# Patient Record
Sex: Female | Born: 1995 | Race: White | Hispanic: No | Marital: Married | Smoking: Never smoker
Health system: Southern US, Community
[De-identification: ages and names within clinical notes are randomized; demographics above are authoritative.]

## PROBLEM LIST (undated history)

## (undated) DIAGNOSIS — Z0000 Encounter for general adult medical examination without abnormal findings: Secondary | ICD-10-CM

## (undated) DIAGNOSIS — E785 Hyperlipidemia, unspecified: Secondary | ICD-10-CM

## (undated) HISTORY — DX: Encounter for general adult medical examination without abnormal findings: Z00.00

## (undated) HISTORY — PX: TONSILLECTOMY: SUR1361

---

## 2017-04-16 ENCOUNTER — Encounter: Payer: Self-pay | Admitting: Nurse Practitioner

## 2017-04-16 ENCOUNTER — Ambulatory Visit (INDEPENDENT_AMBULATORY_CARE_PROVIDER_SITE_OTHER): Payer: 59 | Admitting: Nurse Practitioner

## 2017-04-16 VITALS — BP 118/64 | HR 79 | Temp 98.0°F | Ht 67.0 in | Wt 270.4 lb

## 2017-04-16 DIAGNOSIS — Z23 Encounter for immunization: Secondary | ICD-10-CM | POA: Diagnosis not present

## 2017-04-16 DIAGNOSIS — E663 Overweight: Secondary | ICD-10-CM

## 2017-04-16 DIAGNOSIS — Z7689 Persons encountering health services in other specified circumstances: Secondary | ICD-10-CM

## 2017-04-16 DIAGNOSIS — Z6841 Body Mass Index (BMI) 40.0 and over, adult: Secondary | ICD-10-CM | POA: Diagnosis not present

## 2017-04-16 NOTE — Patient Instructions (Addendum)
[place], Thank you for coming in to clinic today.  1. Start logging your food with MyFitnessPal website or smartphone app.  Establish baseline calorie intake and reduce by about 500 calories per day probably around 1400 - 1600 calories per day.  Continue for 1-2 weeks, then stop calorie counting, continue food log, and continue new eating pattern. - Weight loss goals 1/2 to 1 lb weekly.  - Consider nutrition visits if covered by insurance. Let me know if you would like a referral.  You will be due for FASTING BLOOD WORK at your annual physical (no food or drink after midnight before, only water or coffee without cream/sugar on the morning of).  For Lab Results, once available within 2-3 days of blood draw, you can can log in to MyChart online to view your results and a brief explanation. Also, we can discuss results at next follow-up visit.   Please schedule a follow-up appointment with Wilhelmina McardleLauren Renley Gutman, AGNP to Return in about 2 weeks (around 04/30/2017) for annual physical w/ PAP (2-6 weeks).    If you have any other questions or concerns, please feel free to call the clinic or send a message through MyChart. You may also schedule an earlier appointment if necessary.  Wilhelmina McardleLauren Priyana Mccarey, DNP, AGNP-BC Adult Gerontology Nurse Practitioner [facility], [facility]

## 2017-04-16 NOTE — Progress Notes (Signed)
Subjective:    Patient ID: Tracey Khan, female    DOB: [birth year], 21 y.o.   MRN: [id number]  Tracey CallanderJocelynne Khan is a 21 y.o. female presenting on 04/16/2017 for Establish Care   HPI Establish Care New Provider Pt last seen by PCP many years ago w/ pediatrics at about age 21. Pt brings immunization history for childhood vaccines with her today.  She does not have any records of vaccines after age 21 or any recall of vaccination after age 21.  She went to school for 1-3 grade in [place] and grades 4-12 in [place].  [place] vaccine records do not show any immunizations given.  Feels well no acute concerns today.   Morbid Obesity - Pt perceives herself as overweight and has desire for weight loss of 70 lbs for a goal of 200 lbs.   - She notes typical dietary choices as follows: NO breakfast, but cream and sugar w/ coffee Lunch: light meal - fast food combo w/ french fries 2-3 days per week Dinner: eating out 3-4 times per week, cook vegetables, no fried foods at home Small snack: peanuts, cookies, ice cream  - She is regularly physically active in her job only w/ prolonged standing and walking.   Past Medical History:  Diagnosis Date  . Healthy adult on routine physical examination    Past Surgical History:  Procedure Laterality Date  . TONSILLECTOMY     Social History   Social History  . Marital status: Married    Spouse name: N/A  . Number of children: N/A  . Years of education: N/A   Occupational History  . Not on file.   Social History Main Topics  . Smoking status: Never Smoker  . Smokeless tobacco: Never Used  . Alcohol use 0.6 oz/week    1 Glasses of wine per week     Comment: occasionally  . Drug use: No  . Sexual activity: Yes    Birth control/ protection: None     Comment: mutually monogamous relationship   Other Topics Concern  . Not on file   Social History Narrative  . No narrative on file   Family History  Problem Relation Age of Onset  . Healthy Mother    . Hypertension Father   . Healthy Sister   . Healthy Brother   . Hyperlipidemia Maternal Grandmother   . Heart attack Maternal Grandfather   . Healthy Paternal Grandmother   . Stroke Paternal Grandfather   . Colon cancer Neg Hx   . Breast cancer Neg Hx   . Ovarian cancer Neg Hx    No current outpatient prescriptions on file prior to visit.   No current facility-administered medications on file prior to visit.     Review of Systems  Constitutional: Negative.   HENT: Negative.   Eyes: Negative.   Respiratory: Negative.   Cardiovascular: Negative.   Gastrointestinal: Negative.   Endocrine: Negative.   Genitourinary: Negative.   Skin: Negative.   Allergic/Immunologic: Negative.   Neurological: Positive for headaches.  Hematological: Bruises/bleeds easily.  Psychiatric/Behavioral: Negative.    Per HPI unless specifically indicated above      Objective:    BP 118/64 (BP Location: Right Arm, Patient Position: Sitting, Cuff Size: Large)   Pulse 79   Temp 98 F (36.7 C) (Oral)   Ht 5\' 7"  (1.702 m)   Wt 270 lb 6.4 oz (122.7 kg)   LMP 04/10/2017   BMI 42.35 kg/m   Wt Readings from Last 3 Encounters:  04/16/17 270 lb 6.4 oz (122.7 kg)    Physical Exam  General - morbidly obese, well-appearing, NAD HEENT - Normocephalic, atraumatic, PERRL, EOMI, patent nares w/o congestion, oropharynx clear, MMM Neck - supple, non-tender, no LAD, no thyromegaly Heart - RRR, no murmurs heard Lungs - Clear throughout all lobes, no wheezing, crackles, or rhonchi. Normal work of breathing. Abdomen - soft, obese, NTND, no masses, no hepatosplenomegaly noted w/ scratch test, active bowel sounds Extremeties - non-tender, no edema, cap refill < 2 seconds, peripheral pulses intact +2 bilaterally Skin - warm, dry, no rashes Neuro - awake, alert, oriented x3, normal gait Psych - Normal mood and affect, normal behavior   No results found for this or any previous visit.    Assessment & Plan:     Problem List Items Addressed This Visit      Other   BMI 40.0-44.9, adult (HCC) - Primary    Pt morbidly obese likely related to calorie over nutrition as diet recall indicates many meals out and fast food meals.  Pt stated weight goal is less than 200 lbs.  She states desire to begin losing weight and is motivated to start.  Plan: 1. Reviewed calorie intake and tips for reducing overall calories while staying full. 2. Discussed MyFitnessPal.  Establish baseline intake and reduce by about 500 calories per day probably around 1400 - 1600 calories per day.  Continue for 1-2 weeks, then stop calorie counting, continue food log, and continue new eating pattern. - Weight loss goals 1/2 to 1 lb weekly. 3.Consider nutrition visits if covered by insurance or pt willing to pay out of pocket. 4. Follow up 3 months.         Other Visit Diagnoses    Encounter for vaccination     Pt w/ no recall of last tetanus vaccine.  Her last recorded Dtap was 2001 and she does not remember receiving vaccines in middle school.  Plan: 1. Administer Tdap today.    Encounter to establish care     Pt w/o recent PCP.  Last visit for wellness exam was at approx 21 years of age.  Pt is due for screening PAP smear by age.    Plan: 1. Reviewed steps for PAP smear, pt verbalizes some fear about this new experience.  Reassurance provided.  P would like to schedule her physical w/ PAP in about 2 weeks.        Follow up plan: Return in about 2 weeks (around 04/30/2017) for annual physical w/ PAP (2-6 weeks).  Tracey Mcardle, DNP, AGPCNP-BC Adult Gerontology Primary Care Nurse Practitioner [facility] 04/16/2017, 9:57 AM

## 2017-04-16 NOTE — Assessment & Plan Note (Signed)
Pt morbidly obese likely related to calorie over nutrition as diet recall indicates many meals out and fast food meals.  Pt stated weight goal is less than 200 lbs.  She states desire to begin losing weight and is motivated to start.  Plan: 1. Reviewed calorie intake and tips for reducing overall calories while staying full. 2. Discussed MyFitnessPal.  Establish baseline intake and reduce by about 500 calories per day probably around 1400 - 1600 calories per day.  Continue for 1-2 weeks, then stop calorie counting, continue food log, and continue new eating pattern. - Weight loss goals 1/2 to 1 lb weekly. 3.Consider nutrition visits if covered by insurance or pt willing to pay out of pocket. 4. Follow up 3 months.

## 2017-04-16 NOTE — Progress Notes (Signed)
I have reviewed this encounter including the documentation in this note and/or discussed this patient with the provider, Wilhelmina McardleLauren Kennedy, AGPCNP-BC. I am certifying that I agree with the content of this note as supervising physician.  Saralyn PilarAlexander Hser Belanger, DO [facility] 04/16/2017, 3:10 PM

## 2017-04-30 ENCOUNTER — Other Ambulatory Visit: Payer: Self-pay | Admitting: Nurse Practitioner

## 2017-04-30 ENCOUNTER — Encounter: Payer: Self-pay | Admitting: Nurse Practitioner

## 2017-04-30 ENCOUNTER — Ambulatory Visit (INDEPENDENT_AMBULATORY_CARE_PROVIDER_SITE_OTHER): Payer: 59 | Admitting: Nurse Practitioner

## 2017-04-30 VITALS — BP 130/80 | HR 98 | Temp 97.9°F | Ht 67.0 in | Wt 266.6 lb

## 2017-04-30 DIAGNOSIS — B373 Candidiasis of vulva and vagina: Secondary | ICD-10-CM | POA: Diagnosis not present

## 2017-04-30 DIAGNOSIS — Z124 Encounter for screening for malignant neoplasm of cervix: Secondary | ICD-10-CM

## 2017-04-30 DIAGNOSIS — B3731 Acute candidiasis of vulva and vagina: Secondary | ICD-10-CM

## 2017-04-30 DIAGNOSIS — Z0000 Encounter for general adult medical examination without abnormal findings: Secondary | ICD-10-CM | POA: Diagnosis not present

## 2017-04-30 LAB — COMPREHENSIVE METABOLIC PANEL
ALT: 21 U/L (ref 6–29)
AST: 18 U/L (ref 10–30)
Albumin: 4.1 g/dL (ref 3.6–5.1)
Alkaline Phosphatase: 68 U/L (ref 33–115)
BUN: 11 mg/dL (ref 7–25)
CO2: 25 mmol/L (ref 20–31)
Calcium: 9.4 mg/dL (ref 8.6–10.2)
Chloride: 102 mmol/L (ref 98–110)
Creat: 0.97 mg/dL (ref 0.50–1.10)
Glucose, Bld: 95 mg/dL (ref 65–99)
Potassium: 4.4 mmol/L (ref 3.5–5.3)
Sodium: 137 mmol/L (ref 135–146)
Total Bilirubin: 0.5 mg/dL (ref 0.2–1.2)
Total Protein: 6.6 g/dL (ref 6.1–8.1)

## 2017-04-30 LAB — CBC WITH DIFFERENTIAL/PLATELET
Basophils Absolute: 0 cells/uL (ref 0–200)
Basophils Relative: 0 %
Eosinophils Absolute: 67 cells/uL (ref 15–500)
Eosinophils Relative: 1 %
HCT: 42.1 % (ref 35.0–45.0)
Hemoglobin: 13.8 g/dL (ref 11.7–15.5)
Lymphocytes Relative: 27 %
Lymphs Abs: 1809 cells/uL (ref 850–3900)
MCH: 27.2 pg (ref 27.0–33.0)
MCHC: 32.8 g/dL (ref 32.0–36.0)
MCV: 82.9 fL (ref 80.0–100.0)
MPV: 9.1 fL (ref 7.5–12.5)
Monocytes Absolute: 402 cells/uL (ref 200–950)
Monocytes Relative: 6 %
Neutro Abs: 4422 cells/uL (ref 1500–7800)
Neutrophils Relative %: 66 %
Platelets: 317 10*3/uL (ref 140–400)
RBC: 5.08 MIL/uL (ref 3.80–5.10)
RDW: 13.4 % (ref 11.0–15.0)
WBC: 6.7 10*3/uL (ref 3.8–10.8)

## 2017-04-30 LAB — LIPID PANEL
Cholesterol: 182 mg/dL (ref <200)
HDL: 39 mg/dL — ABNORMAL LOW (ref >50)
LDL Cholesterol: 114 mg/dL — ABNORMAL HIGH (ref <100)
Total CHOL/HDL Ratio: 4.7 Ratio (ref <5.0)
Triglycerides: 143 mg/dL (ref <150)
VLDL: 29 mg/dL (ref <30)

## 2017-04-30 MED ORDER — FLUCONAZOLE 150 MG PO TABS: 150.0000 mg | ORAL_TABLET | Freq: Once | ORAL | 0 refills | Status: AC

## 2017-04-30 NOTE — Progress Notes (Signed)
Subjective:    Patient ID: Tracey Khan, female    DOB: 1996/08/10, 21 y.o.   MRN: [id number]  Tracey Khan is a 21 y.o. female presenting on 04/30/2017 for Annual Exam   HPI Annual Physical Exam Patient has been feeling well.  They have no acute concerns today. Sleeps 7-8 hours per night interrupted once and easily able to return to sleep.  HEALTH MAINTENANCE: Weight/BMI: Down 4 lbs in 2 weeks Physical activity: 4 days last week, stairs at work Diet: reducing carbs, eating vegetables, lean proteins Seatbelt: always Sunscreen: with prolonged exposure usually PAP: never  VACCINES: Tetanus: Given 2 weeks ago 2018  Past Medical History:  Diagnosis Date  . Healthy adult on routine physical examination    Past Surgical History:  Procedure Laterality Date  . TONSILLECTOMY     Social History   Social History  . Marital status: Married    Spouse name: N/A  . Number of children: N/A  . Years of education: N/A   Occupational History  . Not on file.   Social History Main Topics  . Smoking status: Never Smoker  . Smokeless tobacco: Never Used  . Alcohol use 0.6 oz/week    1 Glasses of wine per week     Comment: occasionally  . Drug use: No  . Sexual activity: Yes    Birth control/ protection: None     Comment: mutually monogamous relationship   Other Topics Concern  . Not on file   Social History Narrative  . No narrative on file   Family History  Problem Relation Age of Onset  . Healthy Mother   . Hypertension Father   . Healthy Sister   . Healthy Brother   . Hyperlipidemia Maternal Grandmother   . Heart attack Maternal Grandfather   . Healthy Paternal Grandmother   . Stroke Paternal Grandfather   . Colon cancer Neg Hx   . Breast cancer Neg Hx   . Ovarian cancer Neg Hx    No current outpatient prescriptions on file prior to visit.   No current facility-administered medications on file prior to visit.     Review of Systems Per HPI unless  specifically indicated above     Objective:    BP 130/80 (BP Location: Right Arm, Patient Position: Sitting, Cuff Size: Large)   Pulse 98   Temp 97.9 F (36.6 C) (Oral)   Ht 5\' 7"  (1.702 m)   Wt 266 lb 9.6 oz (120.9 kg)   LMP 04/10/2017   BMI 41.76 kg/m   Wt Readings from Last 3 Encounters:  04/30/17 266 lb 9.6 oz (120.9 kg)  04/16/17 270 lb 6.4 oz (122.7 kg)    Physical Exam  General - overweight, well-appearing, NAD HEENT - Normocephalic, atraumatic, PERRL, EOMI, patent nares w/o congestion, oropharynx clear, MMM Neck - supple, non-tender, no LAD, no thyromegaly Heart - RRR, no murmurs heard Lungs - Clear throughout all lobes, no wheezing, crackles, or rhonchi. Normal work of breathing. Abdomen - soft, NTND, no masses, no hepatosplenomegaly, active bowel sounds GU - Normal external female genitalia without lesions or fusion. Vaginal canal without lesions. Normal appearing cervix without lesions or friability. Copious thick white discharge on exam. Bimanual exam without adnexal masses, enlarged uterus, or cervical motion tenderness. Breast - Normal exam w/ symmetric breasts, no mass, no nipple discharge, no skin changes or tenderness.  Breast - Normal exam w/ symmetric breasts, no mass, no nipple discharge, no skin changes or tenderness.  Extremeties - non-tender, no  edema, cap refill < 2 seconds, peripheral pulses intact +2 bilaterally Skin - warm, dry, no rashes Neuro - awake, alert, oriented x3, CN II-X intact, intact muscle strength 5/5 bilaterally, intact distal sensation to light touch, normal coordination, normal gait Psych - Normal mood and affect, normal behavior    No results found for this or any previous visit.    Assessment & Plan:   Problem List Items Addressed This Visit    None    Visit Diagnoses    Encounter for annual physical exam    -  Primary Physical exam with new findings of vaginal candidiasis.  Well adult with no acute concerns.  Plan: 1. Obtain  health maintenance screenings. 2. Return 1 year for annual physical.   Relevant Orders   CBC with Differential/Platelet   Comprehensive metabolic panel   Lipid panel   TSH   PAP, ThinPrep, Imaging, Medicare   Encounter for Papanicolaou smear for cervical cancer screening     Pt under 22 years of age.  PAP w/o HPV screening recommended and performed.   Relevant Orders   PAP, ThinPrep, Imaging, Medicare   Vaginal candidiasis     Finding on vaginal exam w/ copious thick white discharge.  Plan: 1. POCT Wet Prep confirms yeast hyphae. 2. Treat w/ fluconazole 150 mg tablet one dose today w/ repeat in 3 days. 3. Follow up as needed.      Meds ordered this encounter  Medications  . fluconazole (DIFLUCAN) 150 MG tablet    Sig: Take 1 tablet (150 mg total) by mouth once. Repeat in 3 days.    Dispense:  2 tablet    Refill:  0    Order Specific Question:   Supervising Provider    Answer:   Smitty CordsKARAMALEGOS, ALEXANDER J [[id number]]      Follow up plan: Return in about 1 year (around 04/30/2018) for annual physical.  Wilhelmina McardleLauren Carime Dinkel, DNP, AGPCNP-BC Adult Gerontology Primary Care Nurse Practitioner [facility] 05/04/2017, 11:04 AM

## 2017-04-30 NOTE — Patient Instructions (Addendum)
Tracey Khan, Thank you for coming in to clinic today.  1. Your exam is normal except for a vaginal yeast infection.  2. Labs today.  Results in 2-3 days.  Please schedule a follow-up appointment with Tracey Khan, AGNP. Return in about 1 year (around 04/30/2018) for annual physical.  If you have any other questions or concerns, please feel free to call the clinic or send a message through MyChart. You may also schedule an earlier appointment if necessary.  You will receive a survey after today's visit either digitally by e-mail or paper by [employer] mail. Your experiences and feedback matter to us.  Please respond so we know how we are doing as we provide care for you.  Tracey McardleLauren Jashawna Reever, DNP, AGNP-BC Adult Gerontology Nurse Practitioner [facility], [facility]

## 2017-05-01 LAB — TSH: TSH: 0.75 mIU/L

## 2017-05-04 LAB — POCT WET PREP (WET MOUNT)
Clue Cells Wet Prep Whiff POC: NEGATIVE
Trichomonas Wet Prep HPF POC: ABSENT

## 2017-05-04 LAB — PAP, THIN PREP, IMAGING, MEDICARE

## 2017-05-04 NOTE — Progress Notes (Signed)
I have reviewed this encounter including the documentation in this note and/or discussed this patient with the provider, Lauren Kennedy, AGPCNP-BC. I am certifying that I agree with the content of this note as supervising physician.  Albertha Beattie, DO [facility] 05/04/2017, 12:25 PM 

## 2018-05-06 ENCOUNTER — Encounter: Payer: Self-pay | Admitting: Nurse Practitioner

## 2018-05-06 ENCOUNTER — Other Ambulatory Visit: Payer: Self-pay

## 2018-05-06 ENCOUNTER — Ambulatory Visit (INDEPENDENT_AMBULATORY_CARE_PROVIDER_SITE_OTHER): Payer: 59 | Admitting: Nurse Practitioner

## 2018-05-06 VITALS — BP 132/74 | HR 81 | Temp 98.2°F | Ht 67.0 in | Wt 271.8 lb

## 2018-05-06 DIAGNOSIS — Z0001 Encounter for general adult medical examination with abnormal findings: Secondary | ICD-10-CM | POA: Diagnosis not present

## 2018-05-06 DIAGNOSIS — B354 Tinea corporis: Secondary | ICD-10-CM | POA: Diagnosis not present

## 2018-05-06 LAB — COMPLETE METABOLIC PANEL WITH GFR
AG Ratio: 1.8 (calc) (ref 1.0–2.5)
ALT: 21 U/L (ref 6–29)
AST: 20 U/L (ref 10–30)
Albumin: 4.3 g/dL (ref 3.6–5.1)
Alkaline phosphatase (APISO): 86 U/L (ref 33–115)
BUN: 15 mg/dL (ref 7–25)
CO2: 29 mmol/L (ref 20–32)
Calcium: 9.2 mg/dL (ref 8.6–10.2)
Chloride: 105 mmol/L (ref 98–110)
Creat: 0.91 mg/dL (ref 0.50–1.10)
GFR, Est African American: 104 mL/min/{1.73_m2} (ref >=60)
GFR, Est Non African American: 90 mL/min/{1.73_m2} (ref >=60)
Globulin: 2.4 g/dL (calc) (ref 1.9–3.7)
Glucose, Bld: 98 mg/dL (ref 65–99)
Potassium: 4.8 mmol/L (ref 3.5–5.3)
Sodium: 140 mmol/L (ref 135–146)
Total Bilirubin: 0.3 mg/dL (ref 0.2–1.2)
Total Protein: 6.7 g/dL (ref 6.1–8.1)

## 2018-05-06 LAB — CBC WITH DIFFERENTIAL/PLATELET
Basophils Absolute: 27 cells/uL (ref 0–200)
Basophils Relative: 0.4 %
Eosinophils Absolute: 67 cells/uL (ref 15–500)
Eosinophils Relative: 1 %
HCT: 42.3 % (ref 35.0–45.0)
Hemoglobin: 13.4 g/dL (ref 11.7–15.5)
Lymphs Abs: 2338 cells/uL (ref 850–3900)
MCH: 26 pg — ABNORMAL LOW (ref 27.0–33.0)
MCHC: 31.7 g/dL — ABNORMAL LOW (ref 32.0–36.0)
MCV: 82.1 fL (ref 80.0–100.0)
MPV: 9.2 fL (ref 7.5–12.5)
Monocytes Relative: 5.8 %
Neutro Abs: 3879 cells/uL (ref 1500–7800)
Neutrophils Relative %: 57.9 %
Platelets: 335 10*3/uL (ref 140–400)
RBC: 5.15 10*6/uL — ABNORMAL HIGH (ref 3.80–5.10)
RDW: 13.1 % (ref 11.0–15.0)
Total Lymphocyte: 34.9 %
WBC mixed population: 389 cells/uL (ref 200–950)
WBC: 6.7 10*3/uL (ref 3.8–10.8)

## 2018-05-06 LAB — LIPID PANEL
Cholesterol: 166 mg/dL (ref <200)
HDL: 39 mg/dL — ABNORMAL LOW (ref >50)
LDL Cholesterol (Calc): 109 mg/dL (calc) — ABNORMAL HIGH
Non-HDL Cholesterol (Calc): 127 mg/dL (calc) (ref <130)
Total CHOL/HDL Ratio: 4.3 (calc) (ref <5.0)
Triglycerides: 89 mg/dL (ref <150)

## 2018-05-06 LAB — TSH: TSH: 1.11 mIU/L

## 2018-05-06 MED ORDER — CLOTRIMAZOLE-BETAMETHASONE 1-0.05 % EX CREA: 1.0000 "application " | TOPICAL_CREAM | Freq: Two times a day (BID) | CUTANEOUS | 0 refills | Status: DC

## 2018-05-06 NOTE — Progress Notes (Signed)
Subjective:    Patient ID: Tracey Khan, female    DOB: Oct 06, 1995, 22 y.o.   MRN: [id number]  Tracey Khan is a 22 y.o. female presenting on 05/06/2018 for Annual Exam and Breast Problem (rash on the left breast that itches x 2 mths )   HPI Annual Physical Exam Patient has been feeling well.  They have acute concerns today about rash under her left breast x 2 months. Sleeps 8 hours per night uninterrupted usually.  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  Sugars.  Seatbelt: always Sunscreen: if prolonged exposure PAP: 04/2017 - normal HIV: declines Optometry: about every 2 years Dentistry: 2 x per year  VACCINES: Tetanus: 2018 Gardasil: Discussed   Left breast rash Present x 2 months and is pruritic.  "looks like a heat rash."  Is red if working outside, high humidity.  More irritated with sweating.  Has been using calamine for itchiness.  Skin is intact.  Sometimes flaky, cannot tell if skin or calamine.  Past Medical History:  Diagnosis Date  . Healthy adult on routine physical examination    Past Surgical History:  Procedure Laterality Date  . TONSILLECTOMY     Social History   Socioeconomic History  . Marital status: Married    Spouse name: Not on file  . Number of children: Not on file  . Years of education: Not on file  . Highest education level: Not on file  Occupational History  . Not on file  Social Needs  . Financial resource strain: Not on file  . Food insecurity:    Worry: Not on file    Inability: Not on file  . Transportation needs:    Medical: Not on file    Non-medical: Not on file  Tobacco Use  . Smoking status: Never Smoker  . Smokeless tobacco: Never Used  Substance and Sexual Activity  . Alcohol use: Yes    Comment: occasionally  . Drug use: No  .  Sexual activity: Yes    Birth control/protection: None    Comment: mutually monogamous relationship  Lifestyle  . Physical activity:    Days per week: Not on file    Minutes per session: Not on file  . Stress: Not on file  Relationships  . Social connections:    Talks on phone: Not on file    Gets together: Not on file    Attends religious service: Not on file    Active member of club or organization: Not on file    Attends meetings of clubs or organizations: Not on file    Relationship status: Not on file  . Intimate partner violence:    Fear of current or ex partner: Not on file    Emotionally abused: Not on file    Physically abused: Not on file    Forced sexual activity: Not on file  Other Topics Concern  . Not on file  Social History Narrative  . Not on file   Family History  Problem Relation Age of Onset  . Healthy Mother   . Hypertension Father   . Healthy Sister   . Healthy Brother   . Hyperlipidemia Maternal Grandmother   . Heart attack Maternal Grandfather   . Healthy Paternal Grandmother   . Stroke Paternal Grandfather   . Colon cancer Neg Hx   . Breast  cancer Neg Hx   . Ovarian cancer Neg Hx    No current outpatient medications on file prior to visit.   No current facility-administered medications on file prior to visit.     Review of Systems Per HPI unless specifically indicated above    Objective:    BP 132/74 (BP Location: Left Arm, Patient Position: Sitting, Cuff Size: Large)   Pulse 81   Temp 98.2 F (36.8 C) (Oral)   Ht 5\' 7"  (1.702 m)   Wt 271 lb 12.8 oz (123.3 kg)   LMP 05/02/2018   BMI 42.57 kg/m   Wt Readings from Last 3 Encounters:  05/06/18 271 lb 12.8 oz (123.3 kg)  04/30/17 266 lb 9.6 oz (120.9 kg)  04/16/17 270 lb 6.4 oz (122.7 kg)    Physical Exam  Constitutional: She is oriented to person, place, and time. She appears well-developed and well-nourished. No distress.  HENT:  Head: Normocephalic and atraumatic.  Right Ear:  External ear normal.  Left Ear: External ear normal.  Nose: Nose normal.  Mouth/Throat: Oropharynx is clear and moist.  Eyes: Pupils are equal, round, and reactive to light. Conjunctivae are normal.  Neck: Normal range of motion. Neck supple. No JVD present. No tracheal deviation present. No thyromegaly present.  Cardiovascular: Normal rate, regular rhythm, normal heart sounds and intact distal pulses. Exam reveals no gallop and no friction rub.  No murmur heard. Pulmonary/Chest: Effort normal and breath sounds normal. No respiratory distress.  Breast - Normal exam w/ symmetric breasts, no mass, no nipple discharge, no skin changes or tenderness.  Rash below breast with darkened and slightly erythematous skin pigment, flat without edema or flakiness, skin intact.  Abdominal: Soft. Bowel sounds are normal. She exhibits no distension. There is no hepatosplenomegaly. There is no tenderness.  Musculoskeletal: Normal range of motion.  Lymphadenopathy:    She has no cervical adenopathy.  Neurological: She is alert and oriented to person, place, and time. No cranial nerve deficit.  Skin: Skin is warm and dry. Capillary refill takes less than 2 seconds.  Psychiatric: She has a normal mood and affect. Her behavior is normal. Judgment and thought content normal.  Nursing note and vitals reviewed.    Results for orders placed or performed in visit on 04/30/17  PAP, ThinPrep, Imaging, Medicare  Result Value Ref Range   Specimen adequacy:     FINAL DIAGNOSIS:     COMMENTS:     Cytotechnologist:        Assessment & Plan:   Problem List Items Addressed This Visit    None    Visit Diagnoses    Encounter for annual physical exam    -  Primary   Relevant Orders   Lipid panel   COMPLETE METABOLIC PANEL WITH GFR   CBC with Differential/Platelet   TSH   Tinea corporis       Relevant Medications   clotrimazole-betamethasone (LOTRISONE) cream    Physical exam with new findings of tinea  corporis (specifically located under breast).  Well adult with no other acute concerns. Patient is not interested in contraception today.  Desires pregnancy.    Plan: 1. Obtain health maintenance screenings as above according to age. - Increase physical activity to 30 minutes most days of the week.  - Eat healthy diet high in vegetables and fruits; low in refined carbohydrates.  Low glycemic diet handout provided. - START lotrisone cream application twice daily x 7-10 days. - Encouraged patient to start prenatal vitamin, find  OB-GYN provider for continuing care for pregnancy. 2. Return 1 year for annual physical.     Meds ordered this encounter  Medications  . clotrimazole-betamethasone (LOTRISONE) cream    Sig: Apply 1 application topically 2 (two) times daily.    Dispense:  30 g    Refill:  0    Order Specific Question:   Supervising Provider    Answer:   Smitty CordsKARAMALEGOS, ALEXANDER J [[id number]]     Follow up plan: Return in about 1 year (around 05/07/2019) for annual physical.  Wilhelmina McardleLauren Tamberlyn Midgley, DNP, AGPCNP-BC Adult Gerontology Primary Care Nurse Practitioner [facility] 05/06/2018, 9:06 AM

## 2018-05-06 NOTE — Patient Instructions (Addendum)
Tracey Khan,   Thank you for coming in to clinic today.  1. Work toward increasing healthy fruits and vegetables in your diet.  Have enough protein to stay full and reduce overall food intake throughout the day. This should help you lose 1/2-1 lb per week over the next year if you want to work toward slow, steady and healthy weight loss.  2. Stay active.  3. Let us know if you would like the HPV vaccine.  4. Start lotrisone cream under your breasts twice daily for 7-10 days.    Please schedule a follow-up appointment with Wilhelmina McardleLauren Derryck Shahan, AGNP. Return in about 1 year (around 05/07/2019) for annual physical.  If you have any other questions or concerns, please feel free to call the clinic or send a message through MyChart. You may also schedule an earlier appointment if necessary.  You will receive a survey after today's visit either digitally by e-mail or paper by [employer] mail. Your experiences and feedback matter to us.  Please respond so we know how we are doing as we provide care for you.   Wilhelmina McardleLauren Jaiyah Beining, DNP, AGNP-BC Adult Gerontology Nurse Practitioner [facility], [facility]

## 2019-01-27 ENCOUNTER — Ambulatory Visit (INDEPENDENT_AMBULATORY_CARE_PROVIDER_SITE_OTHER): Payer: 59 | Admitting: Maternal Newborn

## 2019-01-27 ENCOUNTER — Other Ambulatory Visit (HOSPITAL_COMMUNITY)
Admission: RE | Admit: 2019-01-27 | Discharge: 2019-01-27 | Disposition: A | Discharge status: Home / Self Care | Payer: 59 | Source: Ambulatory Visit | Attending: Maternal Newborn | Admitting: Maternal Newborn

## 2019-01-27 ENCOUNTER — Other Ambulatory Visit: Payer: Self-pay

## 2019-01-27 ENCOUNTER — Encounter: Payer: Self-pay | Admitting: Maternal Newborn

## 2019-01-27 VITALS — BP 138/68 | Wt 284.0 lb

## 2019-01-27 DIAGNOSIS — Z124 Encounter for screening for malignant neoplasm of cervix: Secondary | ICD-10-CM

## 2019-01-27 DIAGNOSIS — Z6841 Body Mass Index (BMI) 40.0 and over, adult: Secondary | ICD-10-CM

## 2019-01-27 DIAGNOSIS — O0990 Supervision of high risk pregnancy, unspecified, unspecified trimester: Secondary | ICD-10-CM

## 2019-01-27 DIAGNOSIS — Z369 Encounter for antenatal screening, unspecified: Secondary | ICD-10-CM

## 2019-01-27 DIAGNOSIS — N912 Amenorrhea, unspecified: Secondary | ICD-10-CM

## 2019-01-27 DIAGNOSIS — Z3201 Encounter for pregnancy test, result positive: Secondary | ICD-10-CM | POA: Diagnosis not present

## 2019-01-27 DIAGNOSIS — Z3401 Encounter for supervision of normal first pregnancy, first trimester: Secondary | ICD-10-CM

## 2019-01-27 DIAGNOSIS — Z3A01 Less than 8 weeks gestation of pregnancy: Secondary | ICD-10-CM

## 2019-01-27 LAB — POCT URINALYSIS DIPSTICK OB
Glucose, UA: NEGATIVE
POC,PROTEIN,UA: NEGATIVE

## 2019-01-27 LAB — POCT URINE PREGNANCY: Preg Test, Ur: POSITIVE — AB

## 2019-01-27 NOTE — Progress Notes (Signed)
NOB 

## 2019-01-27 NOTE — Patient Instructions (Signed)
Hello,  Given the current COVID-19 pandemic, our practice is making changes in how we are providing care to our patients. We are limiting in-person visits for the safety of all of our patients.   As a practice, we have met to discuss the best way to minimize visits, but still provide excellent care to our expecting mothers.  We have decided on the following visit structure for low-risk pregnancies.  Initial Pregnancy visit will be conducted as a telephone or web visit, unless you are a new patient to our practice.  Between 10-14 weeks  there will be one in-person visit for an ultrasound, lab work, and genetic screening. 20 weeks in-person visit with an anatomy ultrasound  28 weeks in-person office visit for a 1-hour glucose test and a TDAP vaccination 32 weeks in-person office visit 34 weeks telephone visit 36 weeks in-person office visit for GBS, chlamydia, and gonorrhea testing 38 weeks in-person office visit 40 weeks in-person office visit  Understandably, some patients will require more visits than what is outlined above. Additional visits will be determined on a case-by-case basis.   We will, as always, be available for emergencies or to address concerns that might arise between in-person visits. We ask that you allow [place] the opportunity to address any concerns over the phone or through a virtual visit first. We will be available to return your phone calls throughout the day.   If you are able to purchase a scale, a blood pressure machine, and a home fetal doppler visits could be limited further. This will help decrease your exposure risks, but these purchases are not a necessity.   Things seem to change daily and there is the possibility that this structure could change, please be patient as we adapt to a new way of caring for patients.   Thank you for trusting [place] with your prenatal care. Our practice values you and looks forward to providing you with excellent care.   Sincerely,    [facility], [place]    COVID-19 and Your Pregnancy FAQ  How can I prevent infection with COVID-19 during my pregnancy? Social distancing is key. Please limit any interactions in public. Try and work from home if possible. Frequently wash your hands after touching possibly contaminated surfaces. Avoid touching your face.  Minimize trips to the store. Consider online ordering when possible.   Should I wear a mask? YES. It is recommended by the CDC that all people wear a cloth mask or facial covering in public. You should wear a mask to your visits in the office. This will help reduce transmission as well as your risk or acquiring COVID-19. New studies are showing that even asymptomatic individuals can spread the virus from talking.   Where can I get a mask?  and the city of [place] are partnering to provide masks to community members. You can pick up a mask from several locations. This website also has instructions about how to make a mask by sewing or without sewing by using a t-shirt or bandana.  https://www.[place].gov/i-want-to/learn-about/covid-19-information-and-updates/covid-19-face-mask-project  Studies have shown that if you were a tube or nylon stocking from pantyhose over a cloth mask it makes the cloth mask almost as effective as a N95 mask.  https://www.davis-walter.com/  What are the symptoms of COVID-19? Fever (greater than 100.4 F), dry cough, shortness of breath.  Am I more at risk for COVID-19 since I am pregnant? There is not currently data showing that pregnant women are more adversely  impacted by COVID-19 than the general population. However, we know that pregnant women tend to have worse respiratory complications from similar diseases such as the flu and SARS and for this reason should be considered an at-risk  population.  What do I do if I am experiencing the symptoms of COVID-19? Testing is being limited because of test availability. If you are experiencing symptoms you should quarantine yourself, and the members of your family, for at least 2 weeks at home.   Please visit this website for more information: RunningShows.co.za.html  When should I go to the Emergency Room? Please go to the emergency room if you are experiencing ANY of these symptoms*:  1.    Difficulty breathing or shortness of breath 2.    Persistent pain or pressure in the chest 3.    Confusion or difficulty being aroused (or awakened) 4.    Bluish lips or face  *This list is not all inclusive. Please consult our office for any other symptoms that are severe or concerning.  What do I do if I am having difficulty breathing? You should go to the Emergency Room for evaluation. At this time they have a tent set up for evaluating patients with COVID-19 symptoms.   How will my prenatal care be different because of the COVID-19 pandemic? It has been recommended to reduce the frequency of face-to-face visits and use resources such as telephone and virtual visits when possible. Using a scale, blood pressure machine and fetal doppler at home can further help reduce face-to-face visits. You will be provided with additional information on this topic.  We ask that you come to your visits alone to minimize potential exposures to  COVID-19.  How can I receive childbirth education? At this time in-person classes have been cancelled. You can register for online childbirth education, breastfeeding, and newborn care classes.  Please visit:  CyberComps.hu for more information  How will my hospital birth experience be different? The hospital is currently limiting visitors. This means that while you are in labor you can only have one person at the hospital with you. Additional  family members will not be allowed to wait in the building or outside your room. Your one support person can be the father of the baby, a relative, a doula, or a friend. Once one support person is designated that person will wear a band. This band cannot be shared with multiple people.  Nitrous Gas is not being offered for pain relief since the tubing and filter for the machine can not be sanitized in a way to guarantee prevention of transmission of COVID-19.  Nasal cannula use of oxygen for fetal indications has also been discontinued.  Currently a clear plastic sheet is being hung between mom and the delivering provider during pushing and delivery to help prevent transmission of COVID-19.      How long will I stay in the hospital for after giving birth? It is also recommended that discharge home be expedited during the COVID-19 outbreak. This means staying for 1 day after a vaginal delivery and 2 days after a cesarean section. Patients who need to stay longer for medical reasons are allowed to do so, but the goal will be for expedited discharge home.   What if I have COVID-19 and I am in labor? We ask that you wear a mask while on labor and delivery. We will try and accommodate you being placed in a room that is capable of filtering the air. Please call ahead  if you are in labor and on your way to the hospital. The phone number for labor and delivery at [facility] is [phone].  If I have COVID-19 when my baby is born how can I prevent my baby from contracting COVID-19? This is an issue that will have to be discussed on a case-by-case basis. Current recommendations suggest providing separate isolation rooms for both the mother and new infant as well as limiting visitors. However, there are practical challenges to this recommendation. The situation will assuredly change and decisions will be influenced by the desires of the mother and availability of space.  Some  suggestions are the use of a curtain or physical barrier between mom and infant, hand hygiene, mom wearing a mask, or 6 feet of spacing between a mom and infant.   Can I breastfeed during the COVID-19 pandemic?   Yes, breastfeeding is encouraged.  Can I breastfeed if I have COVID-19? Yes. Covid-19 has not been found in breast milk. This means you cannot give COVID-19 to your child through breast milk. Breast feeding will also help pass antibodies to fight infection to your baby.   What precautions should I take when breastfeeding if I have COVID-19? If a mother and newborn do room-in and the mother wishes to feed at the breast, she should put on a facemask and practice hand hygiene before each feeding.  What precautions should I take when pumping if I have COVID-19? Prior to expressing breast milk, mothers should practice hand hygiene. After each pumping session, all parts that come into contact with breast milk should be thoroughly washed and the entire pump should be appropriately disinfected per the manufacturer's instructions. This expressed breast milk should be fed to the newborn by a healthy caregiver.  What if I am pregnant and work in healthcare? Based on limited data regarding COVID-19 and pregnancy, ACOG currently does not propose creating additional restrictions on pregnant health care personnel because of COVID-19 alone. Pregnant women do not appear to be at higher risk of severe disease related to COVID-19. Pregnant health care personnel should follow CDC risk assessment and infection control guidelines for health care personnel exposed to patients with suspected or confirmed COVID-19. Adherence to recommended infection prevention and control practices is an important part of protecting all health care personnel in health care settings.    Information on COVID-19 in pregnancy is very limited; however, facilities may want to consider limiting exposure of pregnant health care personnel  to patients with confirmed or suspected COVID-19 infection, especially during higher-risk procedures (eg, aerosol-generating procedures), if feasible, based on staffing availability.

## 2019-01-27 NOTE — Progress Notes (Signed)
01/27/2019   Chief Complaint: Amenorrhea, positive home pregnancy test, desires prenatal care.  Transfer of Care Patient: no  History of Present Illness: Tracey Khan is a 23 y.o. G1P0000 at [redacted]w[redacted]d based on Patient's last menstrual period on 12/20/2018 (exact date), with an Estimated Date of Delivery: 09/26/2019, with the above CC.   Her periods were: regular periods She has Positive signs or symptoms of nausea of pregnancy. She has Negative signs or symptoms of miscarriage or preterm labor She identifies Negative Zika risk factors for her and her partner On any different medications around the time she conceived/early pregnancy: No  History of varicella: Yes   Review of Systems  Constitutional: Negative.   HENT: Negative.   Eyes: Negative.   Respiratory: Negative for shortness of breath and wheezing.   Cardiovascular: Negative for chest pain and palpitations.  Gastrointestinal: Positive for nausea.  Genitourinary:       Mild cramping  Musculoskeletal: Positive for back pain.  Skin: Negative.   Neurological: Positive for headaches.  Endo/Heme/Allergies: Negative.   Psychiatric/Behavioral: Negative.   Breasts: positive for breast tenderness  Review of systems was otherwise negative, except as stated in the above HPI.  OBGYN History: As per HPI. OB History  Gravida Para Term Preterm AB Living  1 0 0     0  SAB TAB Ectopic Multiple Live Births               # Outcome Date GA Lbr Len/2nd Weight Sex Delivery Anes PTL Lv  1 Current             Any issues with any prior pregnancies: not applicable Any prior children are healthy, doing well, without any problems or issues: not applicable History of pap smears: Yes. Last pap smear 04/30/2017, NILM History of STIs: No   Past Medical History: Past Medical History:  Diagnosis Date  . Healthy adult on routine physical examination     Past Surgical History: Past Surgical History:  Procedure Laterality Date  . TONSILLECTOMY      Family History:  Family History  Problem Relation Age of Onset  . Healthy Mother   . Hypertension Father   . Healthy Sister   . Healthy Brother   . Hyperlipidemia Maternal Grandmother   . Heart attack Maternal Grandfather   . Healthy Paternal Grandmother   . Stroke Paternal Grandfather   . Colon cancer Neg Hx   . Breast cancer Neg Hx   . Ovarian cancer Neg Hx    She denies any female cancers, bleeding or blood clotting disorders.  She denies any history of intellectual disability, birth defects or genetic disorders in her or the FOB's history  Social History:  Social History   Socioeconomic History  . Marital status: Married    Spouse name: Not on file  . Number of children: Not on file  . Years of education: Not on file  . Highest education level: Not on file  Occupational History  . Not on file  Social Needs  . Financial resource strain: Not on file  . Food insecurity:    Worry: Not on file    Inability: Not on file  . Transportation needs:    Medical: Not on file    Non-medical: Not on file  Tobacco Use  . Smoking status: Never Smoker  . Smokeless tobacco: Never Used  Substance and Sexual Activity  . Alcohol use: Yes    Comment: occasionally  . Drug use: No  . Sexual activity:  Yes    Birth control/protection: None    Comment: mutually monogamous relationship  Lifestyle  . Physical activity:    Days per week: Not on file    Minutes per session: Not on file  . Stress: Not on file  Relationships  . Social connections:    Talks on phone: Not on file    Gets together: Not on file    Attends religious service: Not on file    Active member of club or organization: Not on file    Attends meetings of clubs or organizations: Not on file    Relationship status: Not on file  . Intimate partner violence:    Fear of current or ex partner: Not on file    Emotionally abused: Not on file    Physically abused: Not on file    Forced sexual activity: Not on file   Other Topics Concern  . Not on file  Social History Narrative   Employer: [employer]   Any cats in the household: no Domestic violence screening is negative.  Allergy: No Known Allergies  Current Outpatient Medications: No current outpatient medications on file.   Physical Exam:   BP 138/68   Wt 284 lb (128.8 kg)   LMP 12/20/2018 (Exact Date)   BMI 44.48 kg/m  Body mass index is 44.48 kg/m. Constitutional: Well nourished, well developed female in no acute distress.  Neck:  Supple, normal appearance, and no thyromegaly  Cardiovascular: S1, S2 normal, no murmur, rub or gallop, regular rate and rhythm Respiratory:  Clear to auscultation bilaterally. Normal respiratory effort Abdomen: non tender, non distended Breasts: patient declines to have breast exam. Neuro/Psych:  Normal mood and affect.  Skin:  Warm and dry.  Lymphatic:  No inguinal lymphadenopathy.   Pelvic exam: is not limited by body habitus External genitalia, Bartholin's glands, Urethra, Skene's glands: within normal limits Vagina: within normal limits and with no blood in the vault  Cervix: normal appearing cervix without discharge or lesions, closed/long/high Uterus:  normal contour Adnexa:  normal adnexa  Assessment: Tracey Khan is a 23 y.o. G1P0000 at 3723w3d based on Patient's last menstrual period on 12/20/2018 (exact date), with an Estimated Date of Delivery: 09/26/2019, presenting for prenatal care.  Plan:  1) Avoid alcoholic beverages. 2) Patient encouraged not to smoke.  3) Discontinue the use of all non-medicinal drugs and chemicals.  4) Take prenatal vitamins daily.  5) Seatbelt use advised 6) Nutrition and food safety (fish, cheese advisories, and high nitrite foods)  discussed. 7) [facility]  8) Patient is asked about travel to areas at risk for the Zika virus, and counseled to avoid travel and exposure to mosquitoes or partners who may  have themselves been exposed to the virus. 9) Genetic Screening, such as with 1st Trimester Screening, cell free fetal DNA, AFP testing, and Ultrasound is discussed with patient. She plans to have genetic testing this pregnancy. 10) Currently controlling nausea with diet choices, will let us know if symptoms worsen and she needs medication. 11) GTT and NOB labs ordered for next visit. 12) Dating scan ordered.  Problem list reviewed and updated.  Return in about 2 weeks (around 02/10/2019) for ROB with ultrasound/GTT/NOB labs.  Marcelyn BruinsJacelyn Schmid, CNM [facility], [facility] 01/27/2019

## 2019-01-28 LAB — URINE DRUG PANEL 7
Amphetamines, Urine: NEGATIVE ng/mL
Barbiturate Quant, Ur: NEGATIVE ng/mL
Benzodiazepine Quant, Ur: NEGATIVE ng/mL
Cannabinoid Quant, Ur: NEGATIVE ng/mL
Cocaine (Metab.): NEGATIVE ng/mL
Opiate Quant, Ur: NEGATIVE ng/mL
PCP Quant, Ur: NEGATIVE ng/mL

## 2019-01-29 LAB — URINE CULTURE

## 2019-02-02 LAB — CYTOLOGY - PAP
Chlamydia: NEGATIVE
Diagnosis: NEGATIVE
Neisseria Gonorrhea: NEGATIVE

## 2019-02-14 ENCOUNTER — Encounter: Payer: Self-pay | Admitting: Obstetrics & Gynecology

## 2019-02-14 ENCOUNTER — Ambulatory Visit (INDEPENDENT_AMBULATORY_CARE_PROVIDER_SITE_OTHER): Payer: 59

## 2019-02-14 ENCOUNTER — Other Ambulatory Visit: Payer: Self-pay

## 2019-02-14 ENCOUNTER — Ambulatory Visit (INDEPENDENT_AMBULATORY_CARE_PROVIDER_SITE_OTHER): Payer: 59 | Admitting: Obstetrics & Gynecology

## 2019-02-14 ENCOUNTER — Other Ambulatory Visit: Payer: 59

## 2019-02-14 VITALS — BP 130/80 | Wt 278.0 lb

## 2019-02-14 DIAGNOSIS — Z3687 Encounter for antenatal screening for uncertain dates: Secondary | ICD-10-CM | POA: Diagnosis not present

## 2019-02-14 DIAGNOSIS — Z369 Encounter for antenatal screening, unspecified: Secondary | ICD-10-CM

## 2019-02-14 DIAGNOSIS — O0990 Supervision of high risk pregnancy, unspecified, unspecified trimester: Secondary | ICD-10-CM

## 2019-02-14 DIAGNOSIS — O0991 Supervision of high risk pregnancy, unspecified, first trimester: Secondary | ICD-10-CM

## 2019-02-14 DIAGNOSIS — Z1379 Encounter for other screening for genetic and chromosomal anomalies: Secondary | ICD-10-CM

## 2019-02-14 DIAGNOSIS — Z3A08 8 weeks gestation of pregnancy: Secondary | ICD-10-CM

## 2019-02-14 NOTE — Progress Notes (Signed)
  Subjective  Nausea- min No pain or VB  Objective  BP 130/80   Wt 278 lb (126.1 kg)   LMP 12/20/2018 (Exact Date)   BMI 43.54 kg/m  General: NAD Pumonary: no increased work of breathing Abdomen: gravid, non-tender Extremities: no edema Psychiatric: mood appropriate, affect full  Assessment  23 y.o. G1P0000 at [redacted]w[redacted]d by  09/26/2019, by Last Menstrual Period presenting for routine prenatal visit  Plan   Problem List Items Addressed This Visit      Other   Supervision of high risk pregnancy, antepartum    Other Visit Diagnoses    Encounter for genetic screening for Down Syndrome    -  Primary   Relevant Orders   US Fetal Nuchal Translucency Measurement   [redacted] weeks gestation of pregnancy          Pregnancy#1 Problems (from 12/20/18 to present)    Problem Noted Resolved   Supervision of high risk pregnancy, antepartum 01/27/2019 by Oswaldo Conroy, CNM No   Overview Addendum 02/14/2019 11:56 AM by Nadara Mustard, MD    Clinic [facility] Prenatal Labs  Dating [place] agrees w LMP Blood type:     Genetic Screen 1 Screen:     Antibody:   Anatomic [place]  Rubella:   Varicella:    GTT Early:      Third trimester:  RPR:     Rhogam  HBsAg:     TDaP vaccine                       Flu Shot: HIV:     Baby Food                                GBS:   Contraception  Pap: 01/27/2019 NILM  CBB  no   CS/VBAC n/a   Support Person              BMI 40.0-44.9, adult (HCC) 04/16/2017 by Galen Manila, NP No   Overview Signed 01/27/2019  1:42 PM by Oswaldo Conroy, CNM    BMI >=40 [x ] early 1h gtt - today  [x ] u/s for dating [place] ]  [x ] nutritional goals [x ] folic acid 1mg  [x ] bASA (>12 weeks) [ ]  consider nutrition consult [ ]  consider maternal EKG 1st trimester [ ]  Growth u/s 28 [ ] , 32 [ ] , 36 weeks [ ]  [ ]  NST/AFI weekly 36+ weeks (36[] , 37[] , 38[] , 39[] , 40[] ) [ ]  IOL by 41 weeks (scheduled, prn [] )        Review of ULTRASOUND.    I have personally reviewed images  and report of recent ultrasound done at [facility].    Plan of management to be discussed with patient.  Glucola today, PNL First screen 3 weeks  Annamarie Major, MD, [facility], [facility] 02/14/2019  11:57 AM

## 2019-02-14 NOTE — Patient Instructions (Signed)

## 2019-02-15 LAB — RPR+RH+ABO+RUB AB+AB SCR+CB...
Antibody Screen: NEGATIVE
HIV Screen 4th Generation wRfx: NONREACTIVE
Hematocrit: 39.5 % (ref 34.0–46.6)
Hemoglobin: 13.3 g/dL (ref 11.1–15.9)
Hepatitis B Surface Ag: NEGATIVE
MCH: 26.4 pg — ABNORMAL LOW (ref 26.6–33.0)
MCHC: 33.7 g/dL (ref 31.5–35.7)
MCV: 78 fL — ABNORMAL LOW (ref 79–97)
Platelets: 326 10*3/uL (ref 150–450)
RBC: 5.04 x10E6/uL (ref 3.77–5.28)
RDW: 13.9 % (ref 11.7–15.4)
RPR Ser Ql: NONREACTIVE
Rh Factor: POSITIVE
Rubella Antibodies, IGG: 2.49 index (ref >0.99)
Varicella zoster IgG: 2187 index (ref >165)
WBC: 8.2 10*3/uL (ref 3.4–10.8)

## 2019-02-15 LAB — GLUCOSE, 1 HOUR GESTATIONAL: Gestational Diabetes Screen: 112 mg/dL (ref 65–139)

## 2019-03-16 ENCOUNTER — Ambulatory Visit (INDEPENDENT_AMBULATORY_CARE_PROVIDER_SITE_OTHER): Payer: 59

## 2019-03-16 ENCOUNTER — Ambulatory Visit (INDEPENDENT_AMBULATORY_CARE_PROVIDER_SITE_OTHER): Payer: 59 | Admitting: Obstetrics and Gynecology

## 2019-03-16 ENCOUNTER — Other Ambulatory Visit: Payer: Self-pay

## 2019-03-16 ENCOUNTER — Encounter: Payer: Self-pay | Admitting: Obstetrics and Gynecology

## 2019-03-16 VITALS — BP 138/82 | Wt 277.0 lb

## 2019-03-16 DIAGNOSIS — Z1379 Encounter for other screening for genetic and chromosomal anomalies: Secondary | ICD-10-CM

## 2019-03-16 DIAGNOSIS — Z31438 Encounter for other genetic testing of female for procreative management: Secondary | ICD-10-CM

## 2019-03-16 DIAGNOSIS — Z6841 Body Mass Index (BMI) 40.0 and over, adult: Secondary | ICD-10-CM

## 2019-03-16 DIAGNOSIS — O0990 Supervision of high risk pregnancy, unspecified, unspecified trimester: Secondary | ICD-10-CM

## 2019-03-16 DIAGNOSIS — O358XX0 Maternal care for other (suspected) fetal abnormality and damage, not applicable or unspecified: Secondary | ICD-10-CM

## 2019-03-16 DIAGNOSIS — Z3682 Encounter for antenatal screening for nuchal translucency: Secondary | ICD-10-CM | POA: Diagnosis not present

## 2019-03-16 DIAGNOSIS — O0992 Supervision of high risk pregnancy, unspecified, second trimester: Secondary | ICD-10-CM

## 2019-03-16 DIAGNOSIS — Z3A12 12 weeks gestation of pregnancy: Secondary | ICD-10-CM

## 2019-03-16 DIAGNOSIS — O35DXX0 Maternal care for other (suspected) fetal abnormality and damage, fetal gastrointestinal anomalies, not applicable or unspecified: Secondary | ICD-10-CM

## 2019-03-16 DIAGNOSIS — N83201 Unspecified ovarian cyst, right side: Secondary | ICD-10-CM | POA: Insufficient documentation

## 2019-03-16 NOTE — Progress Notes (Signed)
Routine Prenatal Care Visit  Subjective  Tracey Khan is a 23 y.o. G1P0000 at 6143w2d being seen today for ongoing prenatal care.  She is currently monitored for the following issues for this high-risk pregnancy and has BMI 40.0-44.9, adult (HCC); Supervision of high risk pregnancy, antepartum; and Right ovarian cyst on their problem list.  ----------------------------------------------------------------------------------- Patient reports no complaints.   Contractions: Not present. Vag. Bleeding: None.  Movement: Absent. Denies leaking of fluid.  ----------------------------------------------------------------------------------- The following portions of the patient's history were reviewed and updated as appropriate: allergies, current medications, past family history, past medical history, past social history, past surgical history and problem list. Problem list updated.   Objective  Blood pressure 138/82, weight 277 lb (125.6 kg), last menstrual period 12/20/2018. Pregravid weight 279 lb (126.6 kg) Total Weight Gain -2 lb (-0.907 kg) Urinalysis:      Fetal Status: Fetal Heart Rate (bpm): 165   Movement: Absent     General:  Alert, oriented and cooperative. Patient is in no acute distress.  Skin: Skin is warm and dry. No rash noted.   Cardiovascular: Normal heart rate noted  Respiratory: Normal respiratory effort, no problems with respiration noted  Abdomen: Soft, gravid, appropriate for gestational age. Pain/Pressure: Absent     Pelvic:  Cervical exam deferred        Extremities: Normal range of motion.     ental Status: Normal Khan and affect. Normal behavior. Normal judgment and thought content.   [place] Fetal Nuchal Translucency Measurement  Result Date: 03/16/2019 Patient Name: Tracey Khan DOB: 1995/11/25 MRN: [id number] ULTRASOUND REPORT Location: [facility] Date of Service: 03/16/2019 Indications:First Trimester Screen - NT Findings: Singleton intrauterine pregnancy  is visualized with a CRL consistent with 5647w0d gestation, giving an (U/S) EDD of 09/28/19. The (U/S) EDD is consistent with the clinically established Estimated Date of Delivery: 09/26/19. FHR: 165 BPM CRL measurement: 53.8 mm NT measurement: unable to be assessed due to fetal position Yolk sac is normal (placenta formed) Amnion is visualized. Nasal Bone is Present Abnormal gut herniation seen for this gestational age measuring 1.1 x 0.8 x 0.8cm. Possible omphalocele. Difficulty visualizing lower extremities. No lower leg movement seen. Appear somewhat short. Spine not well visualized due to fetal position.  Right Ovary has large cyst measuring 8.4 x 6.7 x 4.6cm. Possible para-ovarian cyst. Left Ovary is normal in appearance. Fibroid seen left/low 2.7 x 2.4 x 2.6cm. Survey of the adnexa demonstrates no adnexal masses. There is no free peritoneal fluid in the cul de sac. Impression: 1. 7047w0d viable Singleton Intrauterine pregnancy by U/S. 2. (U/S) EDD is consistent with Clinically established Estimated Date of Delivery: 09/26/19. 3. NT Screen is not successfully completed. 3. Difficulty visualizing lower extremities. No movement seen in lower legs. 4. Abnormal gut herniation seen for this gestational age. 5. Single fibroid seen. 6. Right ovarian cyst.    Recommendations: 1.Clinical correlation with the patient's History and Physical Exam. Darlina GuysAbby M Clarke, RT There is a viable  singleton gestation.  The fetal biometry correlates with established dating. Detailed evaluation of the fetal anatomy is precluded by early gestational age.The NT measurement was unable to be obtained secondary to fetal position. Incidental note is made of gut herniation.  The question is as to whether this represents physiologic gut herniation which generally resolves around 10-11 weeks.  Persistence past 12-13 weeks is concerning for omphalocele or gastroschisis. It must be noted that a normal ultrasound is unable to definitively rule out fetal  aneuploidy. Nasal bone  is present, but given inability to obtain NT would recommend proceeding with NIPT testing which was obtained today.  Tracey Mood, MD, [facility], [facility] 03/16/2019, 4:56 PM.    Assessment   23 y.o. G1P0000 at [redacted]w[redacted]d by  09/26/2019, by Last Menstrual Period presenting for routine prenatal visit  Plan   Pregnancy#1 Problems (from 12/20/18 to present)    Problem Noted Resolved   Supervision of high risk pregnancy, antepartum 01/27/2019 by Rexene Agent, CNM No   Overview Addendum 02/14/2019 11:56 AM by Gae Dry, MD    Clinic [facility] Prenatal Labs  Dating [place] agrees w LMP Blood type:     Genetic Screen 1 Screen:     Antibody:   Anatomic [place]  Rubella:   Varicella:    GTT Early:      Third trimester:  RPR:     Rhogam  HBsAg:     TDaP vaccine                       Flu Shot: HIV:     Baby Food                                GBS:   Contraception  Pap: 01/27/2019 NILM  CBB  no   CS/VBAC n/a   Support Person              BMI 40.0-44.9, adult ([place]) 04/16/2017 by Mikey College, NP No   Overview Signed 01/27/2019  1:42 PM by Rexene Agent, CNM    BMI >=40 [ ]  early 1h gtt -  [ ]  u/s for dating [ ]   [ ]  nutritional goals [ ]  folic acid 1mg  [ ]  bASA (>12 weeks) [ ]  consider nutrition consult [ ]  consider maternal EKG 1st trimester [ ]  Growth u/s 28 [ ] , 32 [ ] , 36 weeks [ ]  [ ]  NST/AFI weekly 36+ weeks (36[] , 37[] , 38[] , 39[] , 40[] ) [ ]  IOL by 41 weeks (scheduled, prn [] )          Gestational age appropriate obstetric precautions including but not limited to vaginal bleeding, contractions, leaking of fluid and fetal movement were reviewed in detail with the patient.    Concern for non-physiology midgut herniation (omphalocele vs gastroschisis) NIPT testing obtained, follow up ultrasound.  Right paraovarian cyst noted, simple in appearance possible cyst of morgagni vs hydrosalpinx not visualized on prior scan.   Follow up at time of ultrasound next visit   Return in about 9 days (around 03/25/2019), or 1.5 week ROB follow up ultrasound.  Tracey Mood, MD, [facility], [facility] 03/16/2019, 5:09 PM

## 2019-03-16 NOTE — Progress Notes (Signed)
ROB/NT C/o nausea, spotting for a couple days

## 2019-03-21 LAB — MATERNIT 21 PLUS CORE, BLOOD
Fetal Fraction: 5
Result (T21): NEGATIVE
Trisomy 13 (Patau syndrome): NEGATIVE
Trisomy 18 (Edwards syndrome): NEGATIVE
Trisomy 21 (Down syndrome): NEGATIVE

## 2019-03-30 ENCOUNTER — Ambulatory Visit (INDEPENDENT_AMBULATORY_CARE_PROVIDER_SITE_OTHER): Payer: 59 | Admitting: Obstetrics and Gynecology

## 2019-03-30 ENCOUNTER — Ambulatory Visit (INDEPENDENT_AMBULATORY_CARE_PROVIDER_SITE_OTHER): Payer: 59

## 2019-03-30 ENCOUNTER — Other Ambulatory Visit: Payer: Self-pay

## 2019-03-30 ENCOUNTER — Encounter: Payer: Self-pay | Admitting: Obstetrics and Gynecology

## 2019-03-30 VITALS — BP 130/80 | Wt 275.0 lb

## 2019-03-30 DIAGNOSIS — O358XX0 Maternal care for other (suspected) fetal abnormality and damage, not applicable or unspecified: Secondary | ICD-10-CM

## 2019-03-30 DIAGNOSIS — O0990 Supervision of high risk pregnancy, unspecified, unspecified trimester: Secondary | ICD-10-CM

## 2019-03-30 DIAGNOSIS — O35FXX0 Maternal care for other (suspected) fetal abnormality and damage, fetal musculoskeletal anomalies of trunk, not applicable or unspecified: Secondary | ICD-10-CM

## 2019-03-30 DIAGNOSIS — Z3A14 14 weeks gestation of pregnancy: Secondary | ICD-10-CM

## 2019-03-30 DIAGNOSIS — O3508X0 Maternal care for (suspected) central nervous system malformation or damage in fetus, spina bifida, not applicable or unspecified: Secondary | ICD-10-CM | POA: Insufficient documentation

## 2019-03-30 DIAGNOSIS — O35DXX0 Maternal care for other (suspected) fetal abnormality and damage, fetal gastrointestinal anomalies, not applicable or unspecified: Secondary | ICD-10-CM

## 2019-03-30 DIAGNOSIS — O0992 Supervision of high risk pregnancy, unspecified, second trimester: Secondary | ICD-10-CM

## 2019-03-30 DIAGNOSIS — Z31438 Encounter for other genetic testing of female for procreative management: Secondary | ICD-10-CM

## 2019-03-30 DIAGNOSIS — O350XX0 Maternal care for (suspected) central nervous system malformation in fetus, not applicable or unspecified: Secondary | ICD-10-CM

## 2019-03-30 NOTE — Progress Notes (Signed)
ROB/US C/o nausea

## 2019-03-30 NOTE — Progress Notes (Signed)
Routine Prenatal Care Visit  Subjective  Tracey Khan is a 23 y.o. G1P0000 at 9659w2d being seen today for ongoing prenatal care.  She is currently monitored for the following issues for this high-risk pregnancy and has BMI 40.0-44.9, adult (HCC); Supervision of high risk pregnancy, antepartum; and Right ovarian cyst on their problem list.  ----------------------------------------------------------------------------------- Patient reports no complaints.   Contractions: Not present. Vag. Bleeding: None.  Movement: Absent. Denies leaking of fluid.  ----------------------------------------------------------------------------------- The following portions of the patient's history were reviewed and updated as appropriate: allergies, current medications, past family history, past medical history, past social history, past surgical history and problem list. Problem list updated.   Objective  Blood pressure 130/80, weight 275 lb (124.7 kg), last menstrual period 12/20/2018. Pregravid weight 279 lb (126.6 kg) Total Weight Gain -4 lb (-1.814 kg) Urinalysis:      Fetal Status: Fetal Heart Rate (bpm): 168   Movement: Absent     General:  Alert, oriented and cooperative. Patient is in no acute distress.  Skin: Skin is warm and dry. No rash noted.   Cardiovascular: Normal heart rate noted  Respiratory: Normal respiratory effort, no problems with respiration noted  Abdomen: Soft, gravid, appropriate for gestational age. Pain/Pressure: Absent     Pelvic:  Cervical exam deferred        Extremities: Normal range of motion.  Edema: None  Mental Status: Normal mood and affect. Normal behavior. Normal judgment and thought content.     Assessment   23 y.o. 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 affecting management  of mother in singleton pregnancy, antepartum 03/30/2019 by Natale MilchSchuman, Collin Rengel R, MD No   Fetal omphalocele during pregnancy, antepartum 03/30/2019 by Natale MilchSchuman, Aaliya Maultsby R, MD No   Supervision of high risk pregnancy, antepartum 01/27/2019 by Oswaldo ConroySchmid, Jacelyn Y, CNM No   Overview Addendum 03/30/2019 11:01 PM by Natale MilchSchuman, Tip Atienza R, MD    [facility]inic [facility] Prenatal Labs  Dating US agrees w LMP Blood type: A/Positive/-- (05/18 1155)   Genetic Screen NIPT: normal XY Antibody:Negative (05/18 1155)  Anatomic US Mid-gut herniation (omphalacele?) Spinabifida Rubella: 2.49 (05/18 1155)  Varicella:  Immune  GTT Early: 112     Third trimester:  RPR: Non Reactive (05/18 1155)   Rhogam Not needed HBsAg: Negative (05/18 1155)   TDaP vaccine                       Flu Shot: HIV: Non Reactive (05/18 1155)   Baby Food                                GBS:   Contraception  Pap: 01/27/2019 NILM  CBB  no   CS/VBAC n/a   Support Person              BMI 40.0-44.9, adult (HCC) 04/16/2017 by Galen ManilaKennedy, Lauren Renee, NP No   Overview Signed 01/27/2019  1:42 PM by Oswaldo ConroySchmid, Jacelyn Y, CNM    BMI >=40 [ ]  early 1h gtt -  [ ]  u/s for dating [ ]   [ ]  nutritional goals [ ]  folic acid 1mg  [ ]  bASA (>12 weeks) [ ]  consider nutrition consult [ ]  consider maternal EKG 1st trimester [ ]  Growth u/s 28 [ ] ,  32 [ ] , 36 weeks [ ]  [ ]  NST/AFI weekly 36+ weeks (36[] , 37[] , 38[] , 39[] , 40[] ) [ ]  IOL by 41 weeks (scheduled, prn [] )          Discussed abdominal wall defect and new finding of spina bifida with the patient.  Given handouts and information regarding these conditions.  Will refer to MFM for consultation and further imaging. She is not interested in termination of the pregnancy.  Given a gender reveal envelope.   Return in about 3 weeks (around 04/20/2019) for ROB in person.  Homero Fellers MD [facility], [facility] 03/30/2019, 11:04 PM

## 2019-03-31 LAB — INHERITEST CORE(CF97,SMA,FRAX)

## 2019-04-04 ENCOUNTER — Other Ambulatory Visit: Payer: Self-pay

## 2019-04-04 DIAGNOSIS — O358XX0 Maternal care for other (suspected) fetal abnormality and damage, not applicable or unspecified: Secondary | ICD-10-CM

## 2019-04-04 DIAGNOSIS — O35FXX0 Maternal care for other (suspected) fetal abnormality and damage, fetal musculoskeletal anomalies of trunk, not applicable or unspecified: Secondary | ICD-10-CM

## 2019-04-07 ENCOUNTER — Ambulatory Visit
Admission: RE | Admit: 2019-04-07 | Discharge: 2019-04-07 | Disposition: A | Discharge status: Home / Self Care | Payer: 59 | Source: Ambulatory Visit

## 2019-04-07 ENCOUNTER — Other Ambulatory Visit: Payer: Self-pay

## 2019-04-07 ENCOUNTER — Ambulatory Visit
Admission: RE | Admit: 2019-04-07 | Discharge: 2019-04-07 | Disposition: A | Discharge status: Home / Self Care | Payer: 59 | Source: Ambulatory Visit | Attending: Maternal & Fetal Medicine | Admitting: Maternal & Fetal Medicine

## 2019-04-07 ENCOUNTER — Ambulatory Visit (HOSPITAL_BASED_OUTPATIENT_CLINIC_OR_DEPARTMENT_OTHER)
Admission: RE | Admit: 2019-04-07 | Discharge: 2019-04-07 | Disposition: A | Discharge status: Home / Self Care | Payer: 59 | Source: Ambulatory Visit | Attending: Maternal & Fetal Medicine | Admitting: Maternal & Fetal Medicine

## 2019-04-07 DIAGNOSIS — Z3A15 15 weeks gestation of pregnancy: Secondary | ICD-10-CM | POA: Insufficient documentation

## 2019-04-07 DIAGNOSIS — O358XX0 Maternal care for other (suspected) fetal abnormality and damage, not applicable or unspecified: Secondary | ICD-10-CM | POA: Insufficient documentation

## 2019-04-07 DIAGNOSIS — O35FXX0 Maternal care for other (suspected) fetal abnormality and damage, fetal musculoskeletal anomalies of trunk, not applicable or unspecified: Secondary | ICD-10-CM

## 2019-04-07 HISTORY — DX: Hyperlipidemia, unspecified: E78.5

## 2019-04-07 NOTE — Progress Notes (Signed)
Referring physician:  [facility] Length of consultation:  50 minutes   [name] was seen at [facility] for an ultrasound due to the finding of an omphalocele, suspected NTD and absent movement of lower limbs on ultrasound at [redacted] weeks gestation at [facility] previously.  She is currently 15 weeks, 3 days.  Ultrasound today showed appropriate growth and confirmed the finding of a large omphalocele.  The fetal heart was on the right and stomach on the left.  The structures of the heart and spine were not well visualized due to gestational age and maternal body habitus.  The lower extremities were not seen to move, with legs crossed.  We discussed that an omphalocele is a herniation or pouch from the abdominal wall at the umbilical cord insertion that contains abdominal contents, usually part of the small or large intestine, liver and possibly other organs. Based upon the ultrasound today, it does appear that the fetal liver is inside the defect, though additional imaging is recommended as the pregnancy progresses to further follow this.  We discussed that infants with an omphalocele have an increased risk for other congenital abnormalities. Approximately 30-70% of babies with an omphalocele will have other birth defects, with structural differences of the heart, urinary tract, gastrointestinal tract, cleft lip and/or palate and neural tube defects being the most common.  Omphalocele may occur as an isolated birth defect or are part of numerous genetic conditions. Specifically, there is an increased risk for chromosome abnormalities of approximately 30 percent, most often trisomy 24 or trisomy 65, which are associated with a very poor prognosis.  In the setting of normal chromosomes, omphalocele may be a feature of another genetic syndrome, caused by genetic changes within a single gene or deletion, duplication, or methylation of a series of contiguous genes. One example of a genetic  syndrome with omphalocele as a major presenting feature is Ni[place] Wiedemann syndrome. Beckwith-Wiedemann syndrome (BWS) is a growth disorder characterized by macrosomia, macroglossia, visceromegaly, embryonal tumors, omphalocele, neonatal hypoglycemia, dysmorphic features, and renal abnormalities, as well as developmental delays and other features. Prenatal diagnosis for BWS and other genetic syndromes may be possible through an amniocentesis.    We discussed that when no other congenital abnormalities are found, the majority of infants with an omphalocele will undergo surgery shortly after birth to repair the abdominal wall defect. Studies have shown that their development tends to be relatively normal and, if isolated, this condition is not necessarily associated with intellectual disabilities or other physical disabilities. However, when other birth defects are present along with the omphalocele, the long term health concerns and prognosis may be more serious. This is more concerning for this pregnancy due to the other suspicious findings on ultrasound.  Tracey Khan had prior aneuploidy screening with MaterniT21 PLUS that was normal for chromosomes 13, 18 and 21.  We discussed the screening nature of this testing and the option of diagnostic testing through amniocentesis.  Another option would be adding MaterniT Genome testing to the [place] sample if possible.  This could assess for small deletions or duplications throughout the genome. If she elected amniocentesis, we would offer testing for chromosome conditions, microarray and testing for other syndromes or single gene disorders as appropriate.  After reviewing the risks, benefits and limitations of this testing,  Tracey Khan declined amniocentesis or MaterniT Genome.  She and her husband, who was with [place] via telephone due to [place] 19 restriction, were made aware of the [place] limits for termination of  pregnancy and the couple was clear that they would not  consider termination of this pregnancy. For this reason, they elected to schedule a detailed [facility] 19-[facility].  This is scheduled for 8am on Thursday, May 05, 2019. We will also recommend a fetal echocardiogram to be scheduled after [redacted] weeks gestation.    Lastly, we obtained a detailed family and pregnancy history.  This is the first pregnancy for the patient and her partner.  She has had no complications or exposures that would be expected to increase the risk for birth defects.  In the family history, the patient stated that the father of the baby's father and paternal uncle had umbilical hernias and they had a sister who passed away in infancy.  No details are known about possible medical concerns in this aunt or exactly her age when she passed away, as the family does not discuss it.  Umbilical hernias are distinctly different from omphalocele in their cause and presentation, so it is unlikely that this family history is related to the current findings.  There were no family members with birth defects, intellectual differences, recurrent pregnancy loss or known genetic conditions.  Prior testing for CF, SMA and Fragile X were all negative.   The patient was encouraged to contact us with questions at [phone].   [name], MS, CGC

## 2019-04-20 ENCOUNTER — Other Ambulatory Visit: Payer: Self-pay

## 2019-04-20 ENCOUNTER — Ambulatory Visit (INDEPENDENT_AMBULATORY_CARE_PROVIDER_SITE_OTHER): Payer: 59 | Admitting: Certified Nurse Midwife

## 2019-04-20 VITALS — BP 132/80 | Wt 275.0 lb

## 2019-04-20 DIAGNOSIS — O0992 Supervision of high risk pregnancy, unspecified, second trimester: Secondary | ICD-10-CM

## 2019-04-20 DIAGNOSIS — O358XX0 Maternal care for other (suspected) fetal abnormality and damage, not applicable or unspecified: Secondary | ICD-10-CM

## 2019-04-20 DIAGNOSIS — O0990 Supervision of high risk pregnancy, unspecified, unspecified trimester: Secondary | ICD-10-CM

## 2019-04-20 DIAGNOSIS — Z3A17 17 weeks gestation of pregnancy: Secondary | ICD-10-CM

## 2019-04-20 NOTE — Progress Notes (Signed)
ROB C/o still having some nausea

## 2019-04-20 NOTE — Progress Notes (Signed)
ROB at 17wk2d: Not feeling fetal movement yet. Scan at [facility] confirmed omphalacele. Also SUA, heart on right side of chest. ?spinal bifida. [place] limited by early fetal age. Has follow up ultrasound scheduled at [facility] on 6 August. Declined pregnancy termination and amniocentesis. Declines MSAFP today FHT WNL. FH at U-4FB. A: IUP at 17wk2d Multiple congenital anomalies  P: Ultrasound at [facility] in 2 weeks Follow up at [facility] in 3-4 weeks. Dalia Heading, CNM

## 2019-05-11 ENCOUNTER — Encounter: Payer: 59 | Admitting: Maternal Newborn

## 2019-05-12 ENCOUNTER — Encounter: Payer: 59 | Admitting: Nurse Practitioner

## 2019-07-20 NOTE — Telephone Encounter (Signed)
I don't know why she'd say that [facility] would say it was for gender reveal there isn't an ICD10 code for that.  So the reason was an abnormal NT(unable to be obtained), omphalocele, and genetic screening for down syndrome.  Can we addend the diagnosis codes for the lab to resubmit or do I just need to write a letter to her insurer

## 2021-04-21 IMAGING — US US MFM OB DETAIL +14 WK
1 series · 14 of 28 positions shown · non-contrast
Comparison: none

PATIENT INFO:

PERFORMED BY:
                   Sonographer                              OSONDU
SERVICE(S) PROVIDED:
 ----------------------------------------------------------------------
INDICATIONS:
  15 weeks gestation of pregnancy
FETAL EVALUATION:
 Num Of Fetuses:         1
 Fetal Heart Rate(bpm):  152
 Presentation:           Variable
 Placenta:               Anterior
                             Largest Pocket(cm)
BIOMETRY:
 BPD:      30.5  mm     G. Age:  15w 4d         54  %    CI:        70.82   %    70 - 86
                                                         FL/HC:      18.4   %    15.3 -
 HC:      115.5  mm     G. Age:  15w 5d         46  %    HC/AC:      1.24        1.05 -
 AC:       93.2  mm     G. Age:  15w 4d         54  %    FL/BPD:     69.8   %
 FL:       21.3  mm     G. Age:  16w 3d         80  %    FL/AC:      22.9   %    20 - 24
 HUM:      16.7  mm     G. Age:  14w 6d         32  %
 Est. FW:     138  gm      0 lb 5 oz     65  %
OB HISTORY:
 Gravidity:    1
GESTATIONAL AGE:
 LMP:           15w 3d        Date:  12/20/18                 EDD:   09/26/19
 U/S Today:     15w 6d                                        EDD:   09/23/19
 Best:          15w 3d     Det. By:  LMP  (12/20/18)          EDD:   09/26/19
ANATOMY:
 Cavum:                 CSP visualized         Ductal Arch:            Not visualized
 Ventricles:            Normal appearance      Diaphragm:              Within Normal Limits
 Choroid Plexus:        Within Normal Limits   Stomach:                Seen
 Cerebellum:            Suboptimal             Abdomen:                Within Normal
                                                                       Limits
 Posterior Fossa:       Suboptimal             Abdominal Wall:         Omphalocele
 Nuchal Fold:           Suboptimal             Cord Vessels:           2 Vessel Cord
 Face:                  Suboptimal             Kidneys:                Rt. Mild Pylectasis
                                                                       =
 Lips:                  Suboptimal             Bladder:                Seen
 Heart:                 Limited Views due      Spine:                  Suboptimal views
                        to body habitus
 RVOT:                  Not seen,  due to      Upper Extremities:      Visualized
                        postion
 LVOT:                  Not seen due to        Lower Extremities:      NOT WELL
                        position                                       VISUALIZED
 Aortic Arch:           Not visualized
CERVIX UTERUS ADNEXA:
 Cervix
 Length:            3.8  cm.
 Right Ovary
 Size(cm)       2.9  x   1.4    x  1.9       Vol(ml):
 Cyst measuring 7.0 x 4.2 x 5.0 cm

[Series 1: us mfm ob detail +14 wk · 0.20mm/px · 70 acquisitions, 14 frames shown]
[im 3/70]
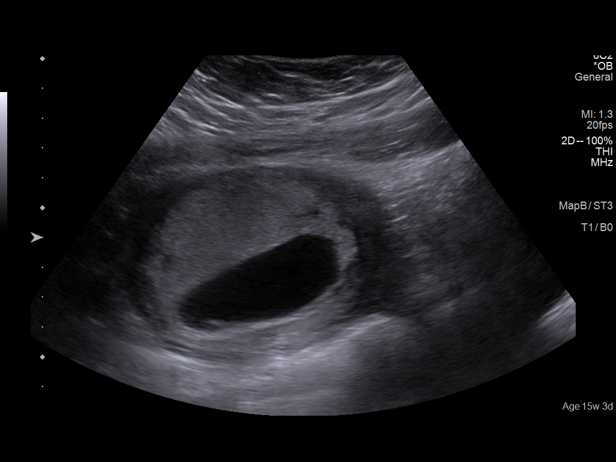
[im 8/70]
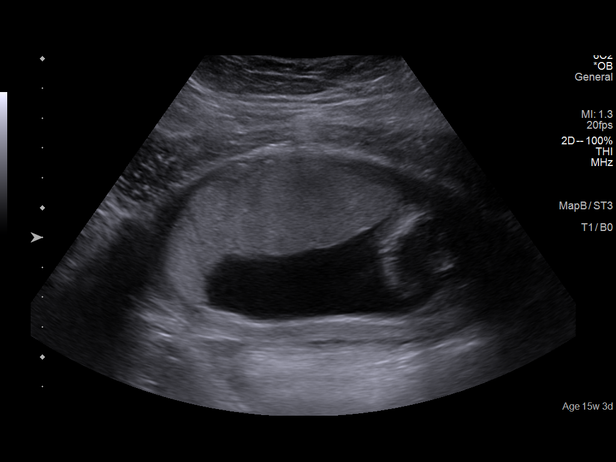
[im 13/70]
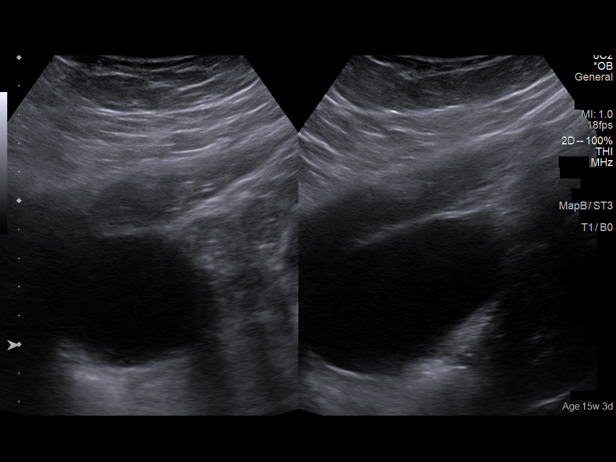
[im 18/70]
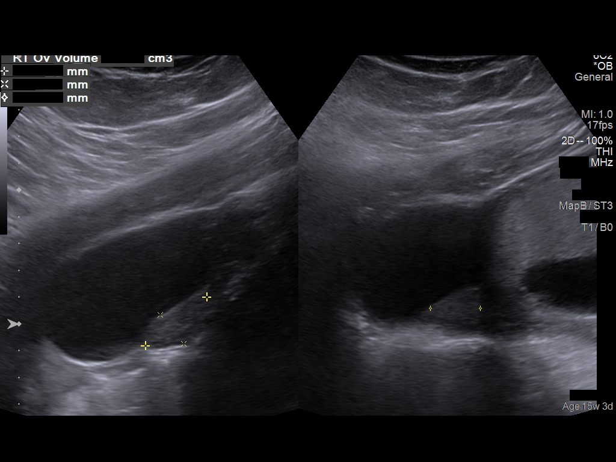
[im 24/70]
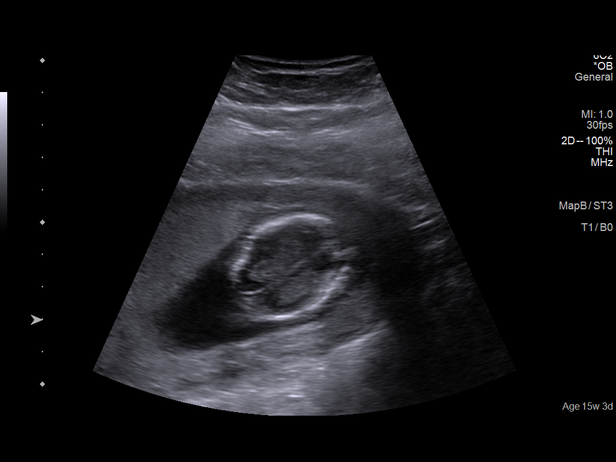
[im 29/70]
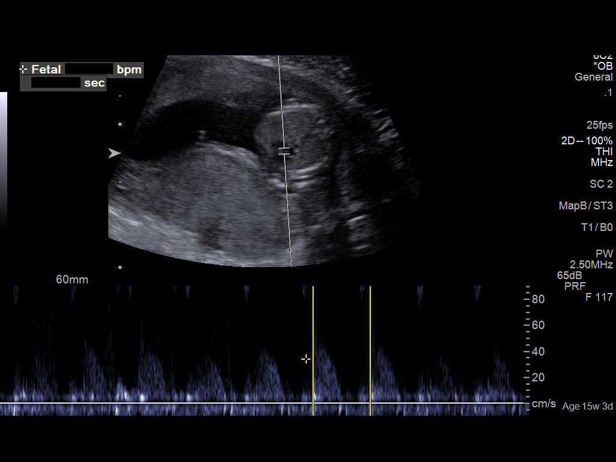
[im 34/70]
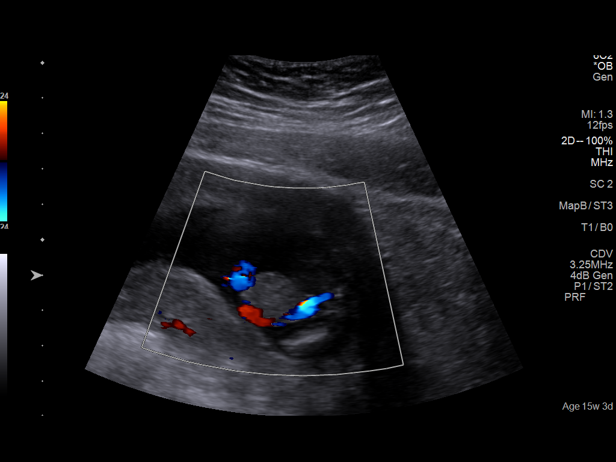
[im 39/70]
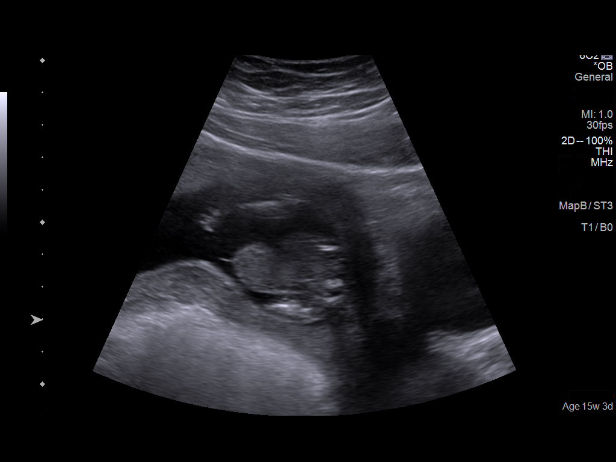
[im 44/70]
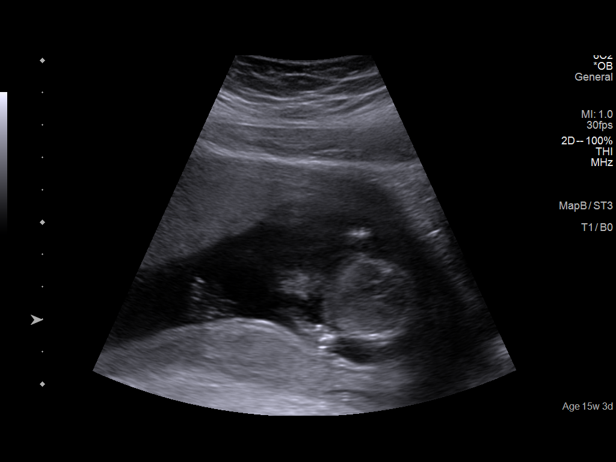
[im 49/70]
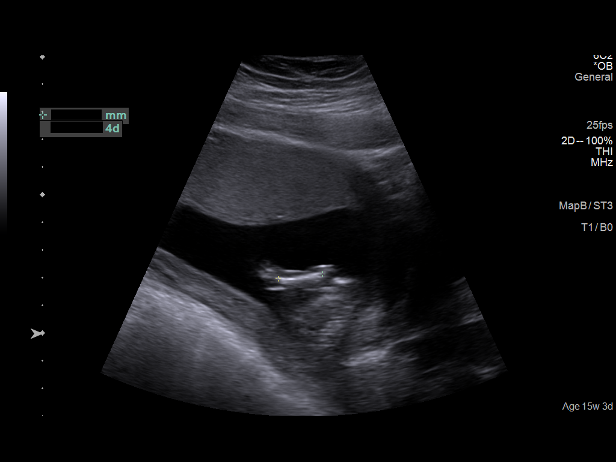
[im 54/70]
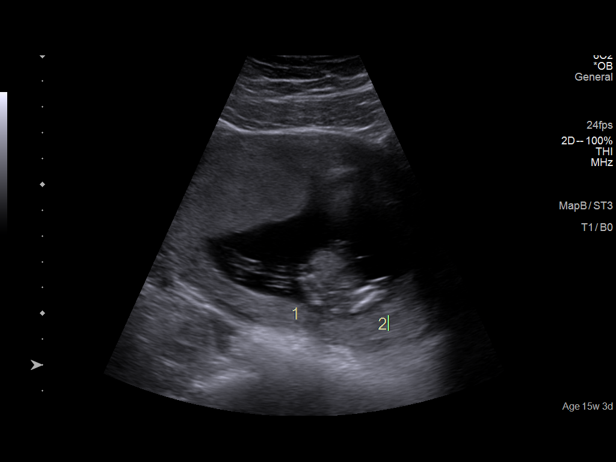
[im 59/70]
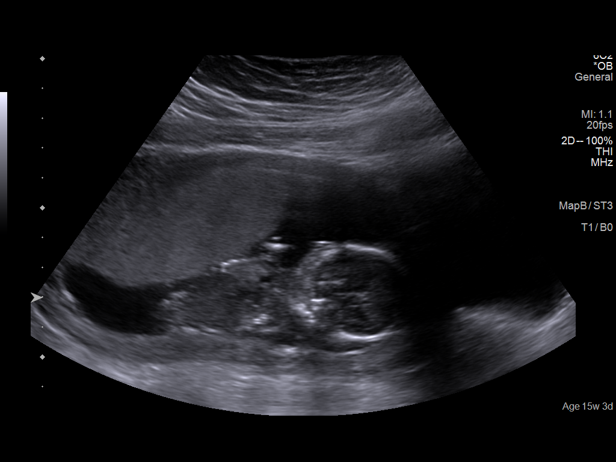
[im 64/70]
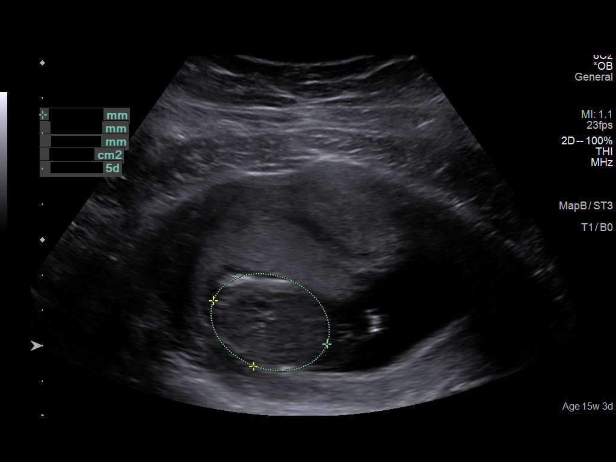
[im 70/70]
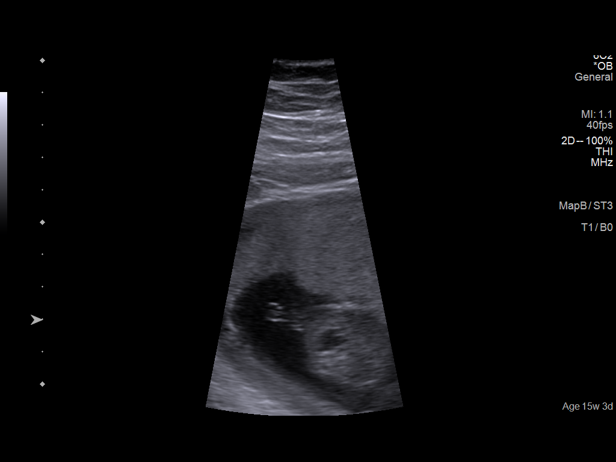

[14 of 28 positions shown; findings below may reference images not displayed]

IMPRESSION: Dear Dr.   OSONDU,

 Thank you for referring your patient for evaluation due to
 multiple anomalies seen on ultrasound in your office
 (suspected fetal abdominal wall defect, right sided stomach,
 suspected NTD and no movement of LE) seen in your office.
 She had negative cffDNA screening.  She had the opportunity
 to meet with our genetic counselor today. Please see that
 note for details.

 Ultrasound demonstrates  a singleton gestation at 15 weeks
 3 days with subjectively normal amniotic fluid volume.  Dating
 is by LMP consistent with earliest available ultrasound
 performed at [facility] on 02/14/19; measurements were
 consistent with 7 weeks  5 days and EDD of 09/26/19.

 The fetal biometry correlates with established dating. Exam
 limited to early gestatoinal age and acoustics. A large fetal
 omphalocele is seen (liver appears to be present in the
 defect). The bladder appears separate and intact, The fetal
 stomach appears on the left.  A single umbilical artery is
 seen.  The fetal heart appears in the right chest.  Fetal
 cardiac anatomy was suboptimally imaged due to positioning
 and habitus.  The fetal spine was also suboptimally imaged
 as were the lower extremities, however, the lower extremites
 did appear persistently in a fixed, contracted position with
 legs crossed.

 The patient was counseled about this finding.  Multiple
 chromosomal abnormalities have been reported among
 fetuses with omphalocele. As many as 60 percent of
 omphaloceles NOT containing liver are associated with fetal
 aneuploidy, particularly trisomy 18 or 13.  The incidence of
 associated structural anomalies generally ranges from 35 to
 70 percent and include gastrointestinal abnormalities, cardiac
 defects, genitourinary anomalies, orofacial clefts, and neural
 tube defect.  Omphalocele has been associated with several
 syndromes including: Pentalogy of [place], amniotic band
 sequence, schisis association (at least two of the following
 defects: neural tube defect, oral cleft, omphalocele,
 diaphragmatic hernia), OEIS syndrome (omphalocele,
 exstrophy of the bladder, imperforate anus, spinal defects),
 Shprintzen syndrome, Laudhoo syndrome, [place] syndrome,
 Yvelisse syndrome, Meckel-Gruber syndrome,
 otopalato-digital type II syndrome, CHARGE (coloboma, heart
 defect, atresia choanae, retarded growth and development,
 genital abnormality, and ear abnormality) syndrome, and
 Beckwith-Wiedemann syndrome (Projekty features:
 macroglossia, gigantism, omphalocele).

 Risks and benefits of invasive diagnostic testing
 amniocentesis were reviewed.  Fetal genetic testing should
 include karyotype, SNP microarray, and testing for Alisson
 De Sousa Cadete syndrome.

 She and her husband decline amniocentesis.  The would not
 pursue pregnancy termination.  They would, however, like to
 pursue a detailed anatomic survey at [facility] and we will
 schedule this exam for ^19-20 weeks.  We also addressed
 the need for a fetal echocardiogram to further evaluate the
 fetal heart.  We addressed the concern that the situs could
 represent a fetal CHD or possibly be in association with a
 fetal deaphragmatic hernia, or could be in assocaition with a
 structurally normal heart.

 The contractions of the lower extremities are concerning for
 possible limb-body wall complex or NTD; we discussed the
 need for evaluation with the fetus larger to better deliniate the
 fetal anatomy.

 We discussed possiblity that she could have prenatal care
 locally and (depending on the follow up assessments) plan
 for tertiary care delivery.  She and her husband understand
 we will be able to provide better delivery planning based on a
 future, more informative anatomic survey.

 Thank you for allowing us to participate in your patient's care.

 assistance.
                  Billiot, Joe

## 2021-11-25 NOTE — Progress Notes (Signed)
 Received on 2/26

## 2024-03-24 ENCOUNTER — Encounter: Payer: Self-pay | Admitting: Obstetrics

## 2024-04-04 ENCOUNTER — Encounter: Payer: Self-pay | Admitting: Obstetrics

## 2024-04-11 NOTE — Progress Notes (Unsigned)
 Referring Provider:  Isaiah Geralds, MD  HPI:  Douglas Ouzts is a 28 y.o.  G3P1010 who presents today for evaluation and management of abnormal cervical cytology.    Prior pap smears:  Date:03/20/24   Eje:ODPO     HPV: Positive, negative 16/18/45  Date:01/27/19  Eje:Wpof              HPV:Not tested  Date:04/30/17    Eje:Wpof              HPV:Not tested  Prior cervical / vaginal findings: n/a  Prior cervical treatment(s): n/a  Symptoms/History:  -Abnormal vaginal discharge: none -Postmenopausal: no -Intermenstrual bleeding: occassionally -Postcoital bleeding: none -Bleeding problems (non-gyn): none -Contraception: none -Number of current sexual partners: 1 -Number of partners in lifetime: 1 -History of a high risk partner: no -History of STDs: no -Smoking: no -Gardasil Vaccine: no      ROS:  Pertinent items are noted in HPI.  OB History  Gravida Para Term Preterm AB Living  3 1 1  1  0  SAB IAB Ectopic Multiple Live Births          # Outcome Date GA Lbr Len/2nd Weight Sex Type Anes PTL Lv  3 AB           2 Term           1 Gravida             Past Medical History:  Diagnosis Date   Healthy adult on routine physical examination    Hyperlipidemia     Past Surgical History:  Procedure Laterality Date   TONSILLECTOMY      SOCIAL HISTORY:  Social History   Substance and Sexual Activity  Alcohol Use Not Currently   Comment: occasionally    Social History   Substance and Sexual Activity  Drug Use No     Family History  Problem Relation Age of Onset   Healthy Mother    Hypertension Father    Healthy Sister    Healthy Brother    Hyperlipidemia Maternal Grandmother    Heart attack Maternal Grandfather    Healthy Paternal Grandmother    Stroke Paternal Grandfather    Colon cancer Neg Hx    Breast cancer Neg Hx    Ovarian cancer Neg Hx     ALLERGIES:  Patient has no known allergies.  She has a current medication list which includes the  following prescription(s): multivitamin-prenatal.  Physical Exam: -Vitals:  BP 130/80   Pulse 97   Ht 5' 7 (1.702 m)   Wt 289 lb 14.4 oz (131.5 kg)   LMP 04/01/2024 (Exact Date)   BMI 45.40 kg/m   PROCEDURE: Colposcopy performed with 4% acetic acid and Lugol's after informed consent obtained.  Physical Exam Vitals and nursing note reviewed. Exam conducted with a chaperone present.  Constitutional:      Appearance: Normal appearance.  HENT:     Head: Normocephalic and atraumatic.  Eyes:     Extraocular Movements: Extraocular movements intact.  Pulmonary:     Effort: Pulmonary effort is normal.  Genitourinary:    General: Normal vulva.     Vagina: Normal.        Comments: RED = acetowhite lesions BLUE = biopsies  Neurological:     General: No focal deficit present.     Mental Status: She is alert.                -Aceto-white Lesions Location(s): See above              -  Biopsy performed at 3 o'clock               -ECC indicated and performed: Yes.       -Biopsy sites made hemostatic with pressure and Monsel's solution   -Satisfactory colposcopy: Yes.      -Evidence of Invasive cervical CA :  NO  ASSESSMENT:  Malgorzata Depinto is a 28 y.o. G3P1010 with LSIL and HPV-HR positive, 16/18/45 NEG on recent pap (03/20/24), here for colposcopy today, performed as above without complications.  -ECC and 1 cervical bx sent to pathology -Aftercare instructions for home reviewed, si/sx of when to call/return discussed. -Gardasil counseling done, #1 given today; RTC in 2 mos to continue series -Reviewed possible outcomes and treatments based on pathology, will call with results    Estil Mangle, DO [facility] [place]

## 2024-04-12 ENCOUNTER — Ambulatory Visit (INDEPENDENT_AMBULATORY_CARE_PROVIDER_SITE_OTHER): Payer: PRIVATE HEALTH INSURANCE | Admitting: Obstetrics

## 2024-04-12 ENCOUNTER — Encounter: Payer: Self-pay | Admitting: Obstetrics

## 2024-04-12 ENCOUNTER — Other Ambulatory Visit (HOSPITAL_COMMUNITY)
Admission: RE | Admit: 2024-04-12 | Discharge: 2024-04-12 | Disposition: A | Discharge status: Home / Self Care | Payer: PRIVATE HEALTH INSURANCE | Source: Ambulatory Visit | Attending: Obstetrics | Admitting: Obstetrics

## 2024-04-12 VITALS — BP 130/80 | HR 97 | Ht 67.0 in | Wt 289.9 lb

## 2024-04-12 DIAGNOSIS — Z23 Encounter for immunization: Secondary | ICD-10-CM | POA: Diagnosis not present

## 2024-04-12 DIAGNOSIS — Z3202 Encounter for pregnancy test, result negative: Secondary | ICD-10-CM

## 2024-04-12 DIAGNOSIS — R87612 Low grade squamous intraepithelial lesion on cytologic smear of cervix (LGSIL): Secondary | ICD-10-CM | POA: Insufficient documentation

## 2024-04-12 DIAGNOSIS — R87810 Cervical high risk human papillomavirus (HPV) DNA test positive: Secondary | ICD-10-CM | POA: Diagnosis present

## 2024-04-12 DIAGNOSIS — N870 Mild cervical dysplasia: Secondary | ICD-10-CM

## 2024-04-12 DIAGNOSIS — B977 Papillomavirus as the cause of diseases classified elsewhere: Secondary | ICD-10-CM

## 2024-04-12 DIAGNOSIS — Z01812 Encounter for preprocedural laboratory examination: Secondary | ICD-10-CM

## 2024-04-12 LAB — POCT URINE PREGNANCY: Preg Test, Ur: NEGATIVE

## 2024-04-12 NOTE — Patient Instructions (Signed)
 Colposcopy, Care After  The following information offers guidance on how to care for yourself after your procedure. Your doctor may also give you more specific instructions. If you have problems or questions, contact your doctor. What can I expect after the procedure? If you did not have a sample of your tissue taken out (did not have a biopsy), you may only have some spotting of blood for a few days. You can go back to your normal activities. If you had a sample of your tissue taken out, it is common to have: Soreness and mild pain. These may last for a few days. Mild bleeding or fluid (discharge) coming from your vagina. The fluid will look dark and grainy. You may have this for a few days. The fluid may be caused by a liquid that was used during your procedure. You may need to wear a sanitary pad. Spotting of blood for at least 48 hours after the procedure. Follow these instructions at home: Medicines Take over-the-counter and prescription medicines only as told by your doctor. Ask your doctor what over-the-counter pain medicines and prescription medicines you can start taking again. This is very important if you take blood thinners. Activity For at least 3 days, or for as long as told by your doctor, avoid: Douching. Using tampons. Having sex. Return to your normal activities as told by your doctor. Ask your doctor what activities are safe for you. General instructions Ask your doctor if you may take baths, swim, or use a hot tub. You may take showers. If you use birth control (contraception), keep using it. Keep all follow-up visits. Contact a doctor if: You have a fever or chills. You faint or feel light-headed. Get help right away if: You bleed a lot from your vagina. A lot of bleeding means that the bleeding soaks through a pad in less than 1 hour. You have clumps of blood (blood clots) coming from your vagina. You have signs that could mean you have an infection. This may be  fluid coming from your vagina that is: Different than normal. Yellow. Bad-smelling. You have very bad pain or cramps in your lower belly that do not get better with medicine. Summary If you did not have a sample of your tissue taken out, you may only have some spotting of blood for a few days. You can go back to your normal activities. If you had a sample of your tissue taken out, it is common to have mild pain for a few days and spotting for 48 hours. Avoid douching, using tampons, and having sex for at least 3 days after the procedure or for as long as told. Get help right away if you have a lot of bleeding, very bad pain, or signs of infection. This information is not intended to replace advice given to you by your health care provider. Make sure you discuss any questions you have with your health care provider. Document Revised: 02/10/2021 Document Reviewed: 02/10/2021 Elsevier Patient Education  2024 [employer].

## 2024-04-13 LAB — SURGICAL PATHOLOGY

## 2024-04-18 ENCOUNTER — Ambulatory Visit: Payer: Self-pay | Admitting: Obstetrics

## 2024-06-15 ENCOUNTER — Ambulatory Visit: Payer: PRIVATE HEALTH INSURANCE

## 2024-06-15 VITALS — BP 144/80 | HR 98 | Ht 67.0 in | Wt 281.8 lb

## 2024-06-15 DIAGNOSIS — Z23 Encounter for immunization: Secondary | ICD-10-CM | POA: Diagnosis not present

## 2024-06-15 NOTE — Progress Notes (Signed)
    NURSE VISIT NOTE  Subjective:    Patient ID: Tracey Khan, female    DOB: 11/24/1995, 28 y.o.   MRN: [id number]  HPI  Patient is a 28 y.o. G23P1011 female Married Caucasian female who presents for her second Gardasil injection. Order to administer given by Estil Mangle, MD on 04/12/2024.   Objective:    Ht 5' 7 (1.702 m)   Wt 281 lb 12.8 oz (127.8 kg)   LMP 05/22/2024 (Approximate)   BMI 44.14 kg/m   28 y.o. LMP:  05/22/24  Contraception:  None Given by: Mathis Getting, CMA Site:  right deltoid  Lab Review  No results found for any visits on 06/15/24.    Assessment:   1. Need for HPV vaccine      Plan:   Patient will return in 4 months for third injection.    Mathis LITTIE Getting, CMA

## 2024-10-12 ENCOUNTER — Ambulatory Visit: Payer: PRIVATE HEALTH INSURANCE

## 2024-10-12 VITALS — BP 135/81 | HR 93 | Resp 16 | Ht 67.0 in | Wt 288.4 lb

## 2024-10-12 DIAGNOSIS — Z23 Encounter for immunization: Secondary | ICD-10-CM

## 2024-10-12 NOTE — Progress Notes (Signed)
" ° ° °  NURSE VISIT NOTE  Subjective:    Patient ID: Tracey Khan, female    DOB: July 23, 1996, 29 y.o.   MRN: [id number]  HPI  Patient is a 29 y.o. G28P1011 female Married Caucasian female who presents for her third Gardasil injection. Order to administer given by Estil Mangle, MD on 074/15/2025.   Objective:    BP 135/81   Pulse 93   Resp 16   Ht 5' 7 (1.702 m)   Wt 288 lb 6.4 oz (130.8 kg)   LMP 09/29/2024   BMI 45.17 kg/m   29 y.o. LMP:  09/29/2024  Contraception:  None Given by: Camelia Fetters, CMA Site:  left deltoid  Lab Review  No results found for any visits on 10/12/24.    Assessment:   1. Need for HPV vaccination      Plan:   Patient will return in prn. Gardasil series complete.     Camelia Fetters, CMA [facility]

## 2024-10-12 NOTE — Patient Instructions (Signed)
 HPV (Human Papillomavirus) Vaccine: What You Need to Know (CDC VIS) Many Vaccine Information Statements are available in Spanish and other languages. See materialclub.com.au To view this CDC Vaccine Information Statement in English, click the link below or scan the QR code: https://pe.elsevier.com/5M9aIgYf  This information is not intended to replace advice given to you by your health care provider. Make sure you discuss any questions you have with your health care provider. Vaccine Information Statements (VISs) are third - party content from [facility] & Immunize.org and Elsevier is not responsible for the content.     Immunize.[employer] Market researcher.     Elsevier Patient Education  [employer].
